# Patient Record
Sex: Female | Born: 1960
Health system: Southern US, Community
[De-identification: ages and names within clinical notes are randomized; demographics above are authoritative.]

## PROBLEM LIST (undated history)

## (undated) DIAGNOSIS — I10 Essential (primary) hypertension: Secondary | ICD-10-CM

## (undated) DIAGNOSIS — A0472 Enterocolitis due to Clostridium difficile, not specified as recurrent: Secondary | ICD-10-CM

## (undated) DIAGNOSIS — J45909 Unspecified asthma, uncomplicated: Secondary | ICD-10-CM

## (undated) DIAGNOSIS — T7840XA Allergy, unspecified, initial encounter: Secondary | ICD-10-CM

## (undated) DIAGNOSIS — H5702 Anisocoria: Secondary | ICD-10-CM

## (undated) DIAGNOSIS — E785 Hyperlipidemia, unspecified: Secondary | ICD-10-CM

## (undated) DIAGNOSIS — F419 Anxiety disorder, unspecified: Secondary | ICD-10-CM

## (undated) HISTORY — DX: Unspecified asthma, uncomplicated: J45.909

## (undated) HISTORY — PX: POLYPECTOMY: SHX149

## (undated) HISTORY — DX: Anisocoria: H57.02

## (undated) HISTORY — DX: Essential (primary) hypertension: I10

## (undated) HISTORY — DX: Anxiety disorder, unspecified: F41.9

## (undated) HISTORY — DX: Hyperlipidemia, unspecified: E78.5

## (undated) HISTORY — DX: Enterocolitis due to Clostridium difficile, not specified as recurrent: A04.72

## (undated) HISTORY — DX: Allergy, unspecified, initial encounter: T78.40XA

---

## 2005-12-03 HISTORY — PX: REFRACTIVE SURGERY: SHX103

## 2011-11-09 ENCOUNTER — Ambulatory Visit (INDEPENDENT_AMBULATORY_CARE_PROVIDER_SITE_OTHER): Payer: Commercial Managed Care - PPO

## 2011-11-09 DIAGNOSIS — G43009 Migraine without aura, not intractable, without status migrainosus: Secondary | ICD-10-CM

## 2011-11-09 DIAGNOSIS — R110 Nausea: Secondary | ICD-10-CM

## 2012-01-29 ENCOUNTER — Telehealth: Payer: Self-pay | Admitting: Family Medicine

## 2012-01-29 MED ORDER — TOPIRAMATE 50 MG PO TABS: 50.0000 mg | ORAL_TABLET | Freq: Two times a day (BID) | ORAL | Status: DC

## 2012-01-29 NOTE — Telephone Encounter (Signed)
Sent in to pharmacy.  

## 2012-01-29 NOTE — Telephone Encounter (Signed)
.  [facility] PATIENT NEEDS REFILL ON TOPOMAX.

## 2012-02-15 ENCOUNTER — Encounter: Payer: Self-pay | Admitting: Physician Assistant

## 2012-04-23 ENCOUNTER — Other Ambulatory Visit: Payer: Self-pay | Admitting: Physician Assistant

## 2012-04-23 ENCOUNTER — Ambulatory Visit (INDEPENDENT_AMBULATORY_CARE_PROVIDER_SITE_OTHER): Payer: Commercial Managed Care - PPO | Admitting: Physician Assistant

## 2012-04-23 DIAGNOSIS — G43909 Migraine, unspecified, not intractable, without status migrainosus: Secondary | ICD-10-CM

## 2012-04-23 MED ORDER — PREDNISONE 20 MG PO TABS: ORAL_TABLET | ORAL | Status: AC

## 2012-04-23 MED ORDER — TOPIRAMATE 50 MG PO TABS: 50.0000 mg | ORAL_TABLET | Freq: Two times a day (BID) | ORAL | Status: DC

## 2012-04-23 MED ORDER — CYCLOBENZAPRINE HCL 5 MG PO TABS
5.0000 mg | ORAL_TABLET | Freq: Three times a day (TID) | ORAL | Status: AC | PRN
Start: 2012-04-23 — End: 2012-05-03

## 2012-04-23 MED ORDER — METOPROLOL SUCCINATE ER 50 MG PO TB24: 50.0000 mg | ORAL_TABLET | Freq: Every day | ORAL | Status: DC

## 2012-04-23 MED ORDER — KETOROLAC TROMETHAMINE 60 MG/2ML IM SOLN
60.0000 mg | Freq: Once | INTRAMUSCULAR | Status: AC
  Administered 2012-04-23: 60 mg via INTRAMUSCULAR

## 2012-04-23 NOTE — Progress Notes (Signed)
Addended by: Sondra Barges on: 04/23/2012 05:47 PM   Modules accepted: Orders

## 2012-04-23 NOTE — Progress Notes (Signed)
Patient ID: Rebecca Bentley MRN: [id number], DOB: 05-Jun-1961, 51 y.o. Date of Encounter: 04/23/2012, 4:54 PM  Primary Physician: No primary provider on file.  Chief Complaint: Migraine  HPI: 51 y.o. year old female with history below presents with typical migraine. Symptoms developed 2 days prior. Typical symptoms. Positive photophobia and phonophobia. Some nausea without emesis. Feels like a band is wrapped around her head. No focal deficits. When symptoms began she took Imitrex which resolved the headache until yesterday evening. At that point she took another Imitrex which helped, but her headache has returned again today. She requests Toradol and Prednisone.    No past medical history on file.   Home Meds: Prior to Admission medications   Medication Sig Start Date End Date Taking? Authorizing Provider  ALPRAZolam Prudy Feeler) 0.5 MG tablet Take 0.5 mg by mouth at bedtime as needed.   Yes Historical Provider, MD  buPROPion (WELLBUTRIN XL) 150 MG 24 hr tablet Take 150 mg by mouth daily.   Yes Historical Provider, MD  escitalopram (LEXAPRO) 20 MG tablet Take 20 mg by mouth daily.   Yes Historical Provider, MD  levonorgestrel-ethinyl estradiol (AVIANE,ALESSE,LESSINA) 0.1-20 MG-MCG tablet Take 1 tablet by mouth daily.   Yes Historical Provider, MD  metoprolol succinate (TOPROL-XL) 50 MG 24 hr tablet Take 50 mg by mouth daily. Take with or immediately following a meal.   Yes Historical Provider, MD  SUMAtriptan (IMITREX) 100 MG tablet Take 100 mg by mouth every 2 (two) hours as needed.   Yes Historical Provider, MD  topiramate (TOPAMAX) 50 MG tablet Take 1 tablet (50 mg total) by mouth 2 (two) times daily. 01/29/12 01/28/13 Yes Sarah Harvie Bridge, PA-C  traZODone (DESYREL) 100 MG tablet Take 100 mg by mouth at bedtime.   Yes Historical Provider, MD  cyclobenzaprine (FLEXERIL) 5 MG tablet Take 1 tablet (5 mg total) by mouth 3 (three) times daily as needed for muscle spasms. 04/23/12 05/03/12  Perle Gibbon M Deamonte Sayegh, PA-C    predniSONE (DELTASONE) 20 MG tablet 3 PO FOR 3 DAYS, 2 PO FOR 3 DAYS, 1 PO FOR 3 DAYS 04/23/12 05/03/12  Sondra Barges, PA-C    Allergies:  Allergies  Allergen Reactions  . Penicillins Anaphylaxis    History   Social History  . Marital Status: Married    Spouse Name: N/A    Number of Children: N/A  . Years of Education: N/A   Occupational History  . Not on file.   Social History Main Topics  . Smoking status: Never Smoker   . Smokeless tobacco: Never Used  . Alcohol Use: Not on file  . Drug Use: Not on file  . Sexually Active: Not on file   Other Topics Concern  . Not on file   Social History Narrative  . No narrative on file     Review of Systems: Constitutional: negative for chills, fever, night sweats, weight changes, or fatigue  HEENT: negative for vision changes, hearing loss, congestion, rhinorrhea, ST, epistaxis, or sinus pressure Cardiovascular: negative for chest pain or palpitations Respiratory: negative for hemoptysis, wheezing, shortness of breath, or cough Abdominal: negative for abdominal pain, nausea, vomiting, diarrhea, or constipation Dermatological: negative for rash Neurologic: negative for dizziness, or syncope All other systems reviewed and are otherwise negative with the exception to those above and in the HPI.   Physical Exam: Blood pressure 118/78, pulse 76, temperature 98.2 F (36.8 C), temperature source Oral, resp. rate 18, height 5' 4.75" (1.645 m), weight 156 lb (70.761 kg), last  menstrual period 03/24/2012, SpO2 98.00%., Body mass index is 26.16 kg/(m^2). General: Well developed, well nourished, in no acute distress. Head: Normocephalic, atraumatic, eyes without discharge, sclera non-icteric, nares are without discharge. Bilateral auditory canals clear, TM's are without perforation, pearly grey and translucent with reflective cone of light bilaterally. Oral cavity moist, posterior pharynx without exudate, erythema, peritonsillar abscess, or  post nasal drip.  Neck: Supple. No thyromegaly. Full ROM. No lymphadenopathy. Lungs: Clear bilaterally to auscultation without wheezes, rales, or rhonchi. Breathing is unlabored. Heart: RRR with S1 S2. No murmurs, rubs, or gallops appreciated. Msk:  Strength and tone normal for age. Extremities/Skin: Warm and dry. No clubbing or cyanosis. No edema. No rashes or suspicious lesions. Neuro: Alert and oriented X 3. Moves all extremities spontaneously. Gait is normal. CNII-XII grossly in tact. DTR 2+ through out. Sensory/motor intact throughout.  Psych:  Responds to questions appropriately with a normal affect.     ASSESSMENT AND PLAN:  51 y.o. year old female with typical migraine. -Toradol 60 mg IM in office -Prednisone 20 mg #18 3x3, 2x3, 1x3 no RF -Flexeril 5 mg 1 po tid prn #90 RF 3 -RTC/ER precautions -Has Imitrex and Topamax at home for use   Signed, Eula Listen, PA-C 04/23/2012 4:54 PM

## 2012-04-23 NOTE — Progress Notes (Signed)
Addended by: Sondra Barges on: 04/23/2012 05:36 PM   Modules accepted: Orders

## 2012-05-21 ENCOUNTER — Telehealth: Payer: Self-pay | Admitting: Physician Assistant

## 2012-05-21 MED ORDER — LEVONORGESTREL-ETHINYL ESTRAD 0.1-20 MG-MCG PO TABS: 1.0000 | ORAL_TABLET | Freq: Every day | ORAL | Status: DC

## 2012-05-21 MED ORDER — ESCITALOPRAM OXALATE 20 MG PO TABS: 20.0000 mg | ORAL_TABLET | Freq: Every day | ORAL | Status: DC

## 2012-05-21 MED ORDER — TRAZODONE HCL 100 MG PO TABS: 100.0000 mg | ORAL_TABLET | Freq: Every day | ORAL | Status: DC

## 2012-05-21 NOTE — Telephone Encounter (Signed)
Patient needs RF of:  lutera Citalopram Trazodone  She will schedule a visit for this summer.  Authorized and sent.

## 2012-07-31 ENCOUNTER — Encounter: Payer: Self-pay | Admitting: Physician Assistant

## 2012-07-31 ENCOUNTER — Ambulatory Visit (INDEPENDENT_AMBULATORY_CARE_PROVIDER_SITE_OTHER): Payer: 59 | Admitting: Physician Assistant

## 2012-07-31 VITALS — BP 126/64 | HR 77 | Temp 98.1°F | Resp 16 | Ht 64.0 in | Wt 160.0 lb

## 2012-07-31 DIAGNOSIS — F419 Anxiety disorder, unspecified: Secondary | ICD-10-CM | POA: Insufficient documentation

## 2012-07-31 DIAGNOSIS — F329 Major depressive disorder, single episode, unspecified: Secondary | ICD-10-CM | POA: Insufficient documentation

## 2012-07-31 DIAGNOSIS — H5702 Anisocoria: Secondary | ICD-10-CM | POA: Insufficient documentation

## 2012-07-31 DIAGNOSIS — G43909 Migraine, unspecified, not intractable, without status migrainosus: Secondary | ICD-10-CM

## 2012-07-31 DIAGNOSIS — Z1211 Encounter for screening for malignant neoplasm of colon: Secondary | ICD-10-CM

## 2012-07-31 DIAGNOSIS — Z0000 Encounter for general adult medical examination without abnormal findings: Secondary | ICD-10-CM

## 2012-07-31 DIAGNOSIS — G4700 Insomnia, unspecified: Secondary | ICD-10-CM

## 2012-07-31 DIAGNOSIS — J45909 Unspecified asthma, uncomplicated: Secondary | ICD-10-CM

## 2012-07-31 DIAGNOSIS — F411 Generalized anxiety disorder: Secondary | ICD-10-CM

## 2012-07-31 DIAGNOSIS — E785 Hyperlipidemia, unspecified: Secondary | ICD-10-CM

## 2012-07-31 LAB — COMPREHENSIVE METABOLIC PANEL
ALT: 12 U/L (ref 0–35)
AST: 17 U/L (ref 0–37)
Albumin: 4.1 g/dL (ref 3.5–5.2)
BUN: 15 mg/dL (ref 6–23)
CO2: 24 mEq/L (ref 19–32)
Calcium: 8.8 mg/dL (ref 8.4–10.5)
Chloride: 107 mEq/L (ref 96–112)
Potassium: 4.3 mEq/L (ref 3.5–5.3)

## 2012-07-31 LAB — CBC WITH DIFFERENTIAL/PLATELET
Basophils Absolute: 0 10*3/uL (ref 0.0–0.1)
HCT: 37.4 % (ref 36.0–46.0)
Hemoglobin: 12.3 g/dL (ref 12.0–15.0)
Lymphocytes Relative: 21 % (ref 12–46)
Lymphs Abs: 1.7 10*3/uL (ref 0.7–4.0)
Monocytes Absolute: 0.4 10*3/uL (ref 0.1–1.0)
Neutro Abs: 5.8 10*3/uL (ref 1.7–7.7)
RBC: 4.31 MIL/uL (ref 3.87–5.11)
RDW: 13.6 % (ref 11.5–15.5)
WBC: 8.2 10*3/uL (ref 4.0–10.5)

## 2012-07-31 LAB — POCT URINALYSIS DIPSTICK
Glucose, UA: NEGATIVE
Nitrite, UA: NEGATIVE
Protein, UA: NEGATIVE
Urobilinogen, UA: 1

## 2012-07-31 LAB — POCT UA - MICROSCOPIC ONLY: Yeast, UA: NEGATIVE

## 2012-07-31 MED ORDER — TOPIRAMATE 50 MG PO TABS: 50.0000 mg | ORAL_TABLET | Freq: Two times a day (BID) | ORAL | Status: DC

## 2012-07-31 MED ORDER — ALPRAZOLAM 0.5 MG PO TABS: 0.5000 mg | ORAL_TABLET | Freq: Three times a day (TID) | ORAL | Status: DC | PRN

## 2012-07-31 MED ORDER — METOPROLOL SUCCINATE ER 50 MG PO TB24: 50.0000 mg | ORAL_TABLET | Freq: Every day | ORAL | Status: DC

## 2012-07-31 MED ORDER — BECLOMETHASONE DIPROPIONATE 40 MCG/ACT IN AERS: 2.0000 | INHALATION_SPRAY | Freq: Two times a day (BID) | RESPIRATORY_TRACT | Status: DC

## 2012-07-31 NOTE — Patient Instructions (Signed)

## 2012-07-31 NOTE — Progress Notes (Signed)
Subjective:    Patient ID: Rebecca Bentley, female    DOB: 12-07-1960, 51 y.o.   MRN: [id number]  HPI This 51 y.o. female presents for CPE.  Pap and HPV testing were normal 08/2011.    Review of Systems  Constitutional: Negative.   HENT: Negative.   Eyes: Negative.   Respiratory: Negative.   Cardiovascular: Negative.   Gastrointestinal: Negative.   Genitourinary: Negative.   Musculoskeletal: Negative.   Skin: Negative.   Neurological: Negative.   Hematological: Negative.   Psychiatric/Behavioral: Disturbed wake/sleep cycle: difficulty staying asleep. Alprazolam helps.      Past Medical History  Diagnosis Date  . Migraine   . Hyperlipidemia   . Anxiety   . Anisocoria   . RAD (reactive airway disease)     Past Surgical History  Procedure Date  . Refractive surgery 2007    Dr. Nile Riggs    Prior to Admission medications   Medication Sig Start Date End Date Taking? Authorizing Provider  ALPRAZolam Prudy Feeler) 0.5 MG tablet Take 0.5 mg by mouth at bedtime as needed.   Yes Historical Provider, MD  buPROPion (WELLBUTRIN XL) 150 MG 24 hr tablet Take 150 mg by mouth daily.   Yes Historical Provider, MD  escitalopram (LEXAPRO) 20 MG tablet Take 1 tablet (20 mg total) by mouth daily. 05/21/12  Yes Marcine Gadway Tessa Lerner, PA-C  levonorgestrel-ethinyl estradiol (AVIANE,ALESSE,LESSINA) 0.1-20 MG-MCG tablet Take 1 tablet by mouth daily. 05/21/12  Yes Audry Kauzlarich S Aubrielle Stroud, PA-C  metoprolol succinate (TOPROL-XL) 50 MG 24 hr tablet Take 1 tablet (50 mg total) by mouth daily. Take with or immediately following a meal. 04/23/12  Yes Ryan M Dunn, PA-C  SUMAtriptan (IMITREX) 100 MG tablet Take 100 mg by mouth every 2 (two) hours as needed.   Yes Historical Provider, MD  topiramate (TOPAMAX) 50 MG tablet Take 1 tablet (50 mg total) by mouth 2 (two) times daily. 04/23/12 04/23/13 Yes Ryan M Dunn, PA-C  traZODone (DESYREL) 100 MG tablet Take 1 tablet (100 mg total) by mouth at bedtime. 05/21/12  Yes Annise Boran Tessa Lerner,  PA-C    Allergies  Allergen Reactions  . Penicillins Anaphylaxis    History   Social History  . Marital Status: Married    Spouse Name: Chrissie Noa    Number of Children: 1  . Years of Education: N/A   Occupational History  . 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 Smoking status: Never Smoker   . Smokeless tobacco: Never Used  . Alcohol Use: Yes  . Drug Use: No  . Sexually Active: Yes -- Female partner(s)   Other Topics Concern  . Not on file   Social History Narrative  . No narrative on file    Family History  Problem Relation Age of Onset  . Hyperlipidemia Father   . Heart disease Father     stenting  . Migraines Father   . Mental illness Brother     anxiety/depression  . Migraines Daughter        Objective:   Physical Exam  Vitals reviewed. Constitutional: She is oriented to person, place, and time. Vital signs are normal. She appears well-developed and well-nourished. No distress.  HENT:  Head: Normocephalic and atraumatic.  Right Ear: Hearing, tympanic membrane, external ear and ear canal normal. No foreign bodies.  Left Ear: Hearing, tympanic membrane, external ear and ear canal normal. No foreign bodies.  Nose: Nose normal.  Mouth/Throat: Uvula is midline, oropharynx  is clear and moist and mucous membranes are normal. No oral lesions. Normal dentition. No dental abscesses or uvula swelling. No oropharyngeal exudate.  Eyes: Conjunctivae and EOM are normal. Pupils are equal, round, and reactive to light. Right eye exhibits no discharge. Left eye exhibits no discharge. No scleral icterus.  Fundoscopic exam:      The right eye shows no arteriolar narrowing, no AV nicking, no exudate, no hemorrhage and no papilledema. The right eye shows red reflex.The right eye shows no venous pulsations.      The left eye shows no arteriolar narrowing, no AV nicking, no exudate, no hemorrhage and no papilledema. The left eye shows red  reflex.The left eye shows no venous pulsations. Neck: Trachea normal, normal range of motion and full passive range of motion without pain. Neck supple. No spinous process tenderness and no muscular tenderness present. No mass and no thyromegaly present.  Cardiovascular: Normal rate, regular rhythm, normal heart sounds, intact distal pulses and normal pulses.   Pulmonary/Chest: Effort normal and breath sounds normal. She exhibits no mass, no crepitus, no edema and no retraction. Right breast exhibits no inverted nipple, no mass, no nipple discharge, no skin change and no tenderness. Left breast exhibits no inverted nipple, no mass, no nipple discharge, no skin change and no tenderness. Breasts are symmetrical.  Genitourinary: Rectum normal and vagina normal.  Musculoskeletal: She exhibits no edema and no tenderness.       Cervical back: Normal.       Thoracic back: Normal.       Lumbar back: Normal.  Lymphadenopathy:       Head (right side): No tonsillar, no preauricular, no posterior auricular and no occipital adenopathy present.       Head (left side): No tonsillar, no preauricular, no posterior auricular and no occipital adenopathy present.    She has no cervical adenopathy.    She has no axillary adenopathy.       Right: No supraclavicular adenopathy present.       Left: No supraclavicular adenopathy present.  Neurological: She is alert and oriented to person, place, and time. She has normal strength and normal reflexes. No cranial nerve deficit. She exhibits normal muscle tone. Coordination and gait normal.  Skin: Skin is warm, dry and intact. No rash noted. She is not diaphoretic. No cyanosis or erythema. Nails show no clubbing.  Psychiatric: She has a normal mood and affect. Her speech is normal and behavior is normal. Judgment and thought content normal.   Results for orders placed in visit on 07/31/12  POCT URINALYSIS DIPSTICK      Component Value Range   Color, UA yellow      Clarity, UA clear     Glucose, UA neg     Bilirubin, UA neg     Ketones, UA neg     Spec Grav, UA 1.020     Blood, UA neg     pH, UA 6.0     Protein, UA neg     Urobilinogen, UA 1.0     Nitrite, UA neg     Leukocytes, UA Trace    POCT UA - MICROSCOPIC ONLY      Component Value Range   WBC, Ur, HPF, POC 0-2     RBC, urine, microscopic 0-1     Bacteria, U Microscopic trace     Mucus, UA neg     Epithelial cells, urine per micros 0-2     Crystals, Ur, HPF, POC neg  Casts, Ur, LPF, POC neg     Yeast, UA neg        Assessment & Plan:   1. Routine general medical examination at a health care facility  POCT urinalysis dipstick, POCT UA - Microscopic Only, CBC with Differential  2. Hyperlipidemia  Comprehensive metabolic panel, Lipid panel  3. Insomnia  TSH, ALPRAZolam (XANAX) 0.5 MG tablet  4. Migraine  topiramate (TOPAMAX) 50 MG tablet, metoprolol succinate (TOPROL-XL) 50 MG 24 hr tablet  5. RAD (reactive airway disease)  beclomethasone (QVAR) 40 MCG/ACT inhaler  6. Anxiety  TSH, ALPRAZolam (XANAX) 0.5 MG tablet  7. Screening for colon cancer  Ambulatory referral to Gastroenterology

## 2012-08-01 ENCOUNTER — Encounter: Payer: Self-pay | Admitting: Internal Medicine

## 2012-08-01 ENCOUNTER — Other Ambulatory Visit: Payer: Self-pay | Admitting: Physician Assistant

## 2012-08-01 ENCOUNTER — Encounter: Payer: Self-pay | Admitting: Physician Assistant

## 2012-08-01 MED ORDER — ATORVASTATIN CALCIUM 20 MG PO TABS: 20.0000 mg | ORAL_TABLET | Freq: Every day | ORAL | Status: DC

## 2012-08-03 HISTORY — PX: COLONOSCOPY: SHX5424

## 2012-08-12 ENCOUNTER — Encounter: Payer: Self-pay | Admitting: Internal Medicine

## 2012-08-12 ENCOUNTER — Ambulatory Visit (AMBULATORY_SURGERY_CENTER): Payer: 59

## 2012-08-12 VITALS — Ht 64.0 in | Wt 160.6 lb

## 2012-08-12 DIAGNOSIS — Z1211 Encounter for screening for malignant neoplasm of colon: Secondary | ICD-10-CM

## 2012-08-12 MED ORDER — NA SULFATE-K SULFATE-MG SULF 17.5-3.13-1.6 GM/177ML PO SOLN: 1.0000 | Freq: Once | ORAL | Status: DC

## 2012-08-21 ENCOUNTER — Encounter: Payer: Self-pay | Admitting: Internal Medicine

## 2012-08-25 ENCOUNTER — Ambulatory Visit (AMBULATORY_SURGERY_CENTER): Payer: 59 | Admitting: Internal Medicine

## 2012-08-25 ENCOUNTER — Encounter: Payer: Self-pay | Admitting: Internal Medicine

## 2012-08-25 VITALS — BP 125/81 | HR 88 | Temp 99.0°F | Resp 16 | Ht 64.0 in | Wt 160.0 lb

## 2012-08-25 DIAGNOSIS — D129 Benign neoplasm of anus and anal canal: Secondary | ICD-10-CM

## 2012-08-25 DIAGNOSIS — Z1211 Encounter for screening for malignant neoplasm of colon: Secondary | ICD-10-CM

## 2012-08-25 DIAGNOSIS — D126 Benign neoplasm of colon, unspecified: Secondary | ICD-10-CM

## 2012-08-25 DIAGNOSIS — D128 Benign neoplasm of rectum: Secondary | ICD-10-CM

## 2012-08-25 MED ORDER — SODIUM CHLORIDE 0.9 % IV SOLN: 500.0000 mL | INTRAVENOUS | Status: DC

## 2012-08-25 NOTE — Progress Notes (Signed)
Propofol given and oxygen managed per J Nulty CRNA  Abdominal pressure applied to reach cecum

## 2012-08-25 NOTE — Progress Notes (Signed)
Patient did not experience any of the following events: a burn prior to discharge; a fall within the facility; wrong site/side/patient/procedure/implant event; or a hospital transfer or hospital admission upon discharge from the facility. (G8907) Patient did not have preoperative order for IV antibiotic SSI prophylaxis. (G8918)  

## 2012-08-25 NOTE — Patient Instructions (Addendum)
The colonoscopy showed one polyp, removed. It looks benign. It was otherwise normal.  I will send you a letter about the pathology results and timing of the next routine colonoscopy.  Thank you for choosing me and [facility].  Iva Boop, MD, FACG  YOU HAD AN ENDOSCOPIC PROCEDURE TODAY AT THE [facility]: Refer to the procedure report that was given to you for any specific questions about what was found during the examination.  If the procedure report does not answer your questions, please call your gastroenterologist to clarify.  If you requested that your care partner not be given the details of your procedure findings, then the procedure report has been included in a sealed envelope for you to review at your convenience later.  YOU SHOULD EXPECT: Some feelings of bloating in the abdomen. Passage of more gas than usual.  Walking can help get rid of the air that was put into your GI tract during the procedure and reduce the bloating. If you had a lower endoscopy (such as a colonoscopy or flexible sigmoidoscopy) you may notice spotting of blood in your stool or on the toilet paper. If you underwent a bowel prep for your procedure, then you may not have a normal bowel movement for a few days.  DIET: Your first meal following the procedure should be a light meal and then it is ok to progress to your normal diet.  A half-sandwich or bowl of soup is an example of a good first meal.  Heavy or fried foods are harder to digest and may make you feel nauseous or bloated.  Likewise meals heavy in dairy and vegetables can cause extra gas to form and this can also increase the bloating.  Drink plenty of fluids but you should avoid alcoholic beverages for 24 hours.  ACTIVITY: Your care partner should take you home directly after the procedure.  You should plan to take it easy, moving slowly for the rest of the day.  You can resume normal activity the day after the procedure however  you should NOT DRIVE or use heavy machinery for 24 hours (because of the sedation medicines used during the test).    SYMPTOMS TO REPORT IMMEDIATELY: A gastroenterologist can be reached at any hour.  During normal business hours, 8:30 AM to 5:00 PM Monday through Friday, call [phone].  After hours and on weekends, please call the GI answering service at [phone] who will take a message and have the physician on call contact you.   Following lower endoscopy (colonoscopy or flexible sigmoidoscopy):  Excessive amounts of blood in the stool  Significant tenderness or worsening of abdominal pains  Swelling of the abdomen that is new, acute  Fever of 100F or higher  Following upper endoscopy (EGD)  Vomiting of blood or coffee ground material  New chest pain or pain under the shoulder blades  Painful or persistently difficult swallowing  New shortness of breath  Fever of 100F or higher  Black, tarry-looking stools  FOLLOW UP: If any biopsies were taken you will be contacted by phone or by letter within the next 1-3 weeks.  Call your gastroenterologist if you have not heard about the biopsies in 3 weeks.  Our staff will call the home number listed on your records the next business day following your procedure to check on you and address any questions or concerns that you may have at that time regarding the information given to you following your procedure. This is  a courtesy call and so if there is no answer at the home number and we have not heard from you through the emergency physician on call, we will assume that you have returned to your regular daily activities without incident.  SIGNATURES/CONFIDENTIALITY: You and/or your care partner have signed paperwork which will be entered into your electronic medical record.  These signatures attest to the fact that that the information above on your After Visit Summary has been reviewed and is understood.  Full responsibility of the  confidentiality of this discharge information lies with you and/or your care-partner.

## 2012-08-25 NOTE — Op Note (Signed)
[facility] [address] [place], [address]   COLONOSCOPY PROCEDURE REPORT  PATIENT: Rebecca Bentley, Rebecca Bentley  MR#: [id number] BIRTHDATE: 1961/07/19 , 51  yrs. old GENDER: Female ENDOSCOPIST: Iva Boop, MD, [facility] REFERRED XB:JYNWGN Tinnie Gens, [place] PROCEDURE DATE:  08/25/2012 PROCEDURE:   Colonoscopy with snare polypectomy ASA CLASS:   Class II INDICATIONS:average risk screening. MEDICATIONS: Propofol (Diprivan) 280 mg IV, MAC sedation, administered by CRNA, and These medications were titrated to patient response per physician's verbal order  DESCRIPTION OF PROCEDURE:   After the risks benefits and alternatives of the procedure were thoroughly explained, informed consent was obtained.  A digital rectal exam revealed no abnormalities of the rectum.   The LB [id number]-[id number]  endoscope was introduced through the anus and advanced to the cecum, which was identified by both the appendix and ileocecal valve. No adverse events experienced.   The quality of the prep was Suprep excellent The instrument was then slowly withdrawn as the colon was fully examined.      COLON FINDINGS: A polypoid shaped pedunculated polyp measuring 8 mm in size was found in the rectum.  A polypectomy was performed using snare cautery.  The resection was complete and the polyp tissue was completely retrieved.   The colon mucosa was otherwise normal. Retroflexed views revealed no abnormalities. The time to cecum=5 minutes 10 seconds.  Withdrawal time=10 minutes 49 seconds.  The scope was withdrawn and the procedure completed. COMPLICATIONS: There were no complications.  ENDOSCOPIC IMPRESSION: 1.   Pedunculated polyp measuring 8 mm in size was found in the rectum; polypectomy was performed using snare cautery 2.   The colon mucosa was otherwise normal with excellent prep  RECOMMENDATIONS: 1.  Timing of repeat colonoscopy will be determined by pathology findings. 2.  Hold aspirin, aspirin  products, and anti-inflammatory medication for 1 week.   eSigned:  Iva Boop, MD, [facility] 08/25/2012 11:21 AM   cc: The Patient    and Theora Gianotti, PA-C

## 2012-08-26 ENCOUNTER — Telehealth: Payer: Self-pay | Admitting: *Deleted

## 2012-08-26 NOTE — Telephone Encounter (Signed)
  Follow up Call-  Call back number 08/25/2012  Post procedure Call Back phone  # [phone]  Permission to leave phone message Yes     [facility]

## 2012-08-29 ENCOUNTER — Encounter: Payer: Self-pay | Admitting: Internal Medicine

## 2012-08-29 DIAGNOSIS — Z860100 Personal history of colon polyps, unspecified: Secondary | ICD-10-CM | POA: Insufficient documentation

## 2012-08-29 HISTORY — DX: Personal history of colonic polyps: Z86.010

## 2012-08-29 HISTORY — DX: Personal history of colon polyps, unspecified: Z86.0100

## 2012-08-29 NOTE — Progress Notes (Signed)
Quick Note:  8 mm tubular adenoma Repeat colonoscopy about 08/2017 ______

## 2012-10-10 ENCOUNTER — Ambulatory Visit (INDEPENDENT_AMBULATORY_CARE_PROVIDER_SITE_OTHER): Payer: 59 | Admitting: Physician Assistant

## 2012-10-10 VITALS — BP 138/90 | HR 86 | Temp 98.6°F | Resp 16 | Ht 64.0 in | Wt 164.4 lb

## 2012-10-10 DIAGNOSIS — K0510 Chronic gingivitis, plaque induced: Secondary | ICD-10-CM

## 2012-10-10 DIAGNOSIS — K0889 Other specified disorders of teeth and supporting structures: Secondary | ICD-10-CM

## 2012-10-10 DIAGNOSIS — K089 Disorder of teeth and supporting structures, unspecified: Secondary | ICD-10-CM

## 2012-10-10 MED ORDER — CLINDAMYCIN HCL 300 MG PO CAPS: 300.0000 mg | ORAL_CAPSULE | Freq: Three times a day (TID) | ORAL | Status: DC

## 2012-10-10 MED ORDER — BENZOCAINE 20 % MT GEL: 1.0000 "application " | Freq: Four times a day (QID) | OROMUCOSAL | Status: DC | PRN

## 2012-10-10 MED ORDER — HYDROCODONE-ACETAMINOPHEN 5-325 MG PO TABS: 1.0000 | ORAL_TABLET | Freq: Four times a day (QID) | ORAL | Status: DC | PRN

## 2012-10-10 NOTE — Progress Notes (Signed)
Patient ID: Rebecca Bentley MRN: [id number], DOB: 1961/11/12, 51 y.o. Date of Encounter: 10/10/2012, 4:55 PM  Primary Physician: JEFFERY,CHELLE, PA-C  Chief Complaint: Tooth pain for 5 days  HPI: 51 y.o. year old female with history below presents with five day history of pain behind her last tooth on the bottom left. Patient notes this area to be tender to the touch. Has not had any drainage or discharge from this area. No pain with hot or cold drinks or food. Afebrile. No nausea or vomiting.    Past Medical History  Diagnosis Date  . Migraine   . Hyperlipidemia   . Anxiety   . Anisocoria   . RAD (reactive airway disease)   . Allergy      Home Meds: Prior to Admission medications   Medication Sig Start Date End Date Taking? Authorizing Provider  ALPRAZolam Prudy Feeler) 0.5 MG tablet Take 1 tablet (0.5 mg total) by mouth 3 (three) times daily as needed. 07/31/12  Yes Chelle S Jeffery, PA-C  atorvastatin (LIPITOR) 20 MG tablet Take 1 tablet (20 mg total) by mouth daily. 08/01/12 08/01/13 Yes Chelle S Jeffery, PA-C  beclomethasone (QVAR) 40 MCG/ACT inhaler Inhale 2 puffs into the lungs 2 (two) times daily. 07/31/12 07/31/13 Yes Chelle S Jeffery, PA-C  buPROPion (WELLBUTRIN XL) 150 MG 24 hr tablet Take 150 mg by mouth daily.   Yes Historical Provider, MD  escitalopram (LEXAPRO) 20 MG tablet Take 1 tablet (20 mg total) by mouth daily. 05/21/12  Yes Chelle Tessa Lerner, PA-C  levonorgestrel-ethinyl estradiol (AVIANE,ALESSE,LESSINA) 0.1-20 MG-MCG tablet Take 1 tablet by mouth daily. 05/21/12  Yes Chelle S Jeffery, PA-C  loratadine (CLARITIN) 10 MG tablet Take 10 mg by mouth daily.   Yes Historical Provider, MD  metoprolol succinate (TOPROL-XL) 50 MG 24 hr tablet Take 1 tablet (50 mg total) by mouth daily. Take with or immediately following a meal. 07/31/12  Yes Chelle S Jeffery, PA-C  Multiple Vitamin (MULTIVITAMIN) tablet Take 1 tablet by mouth daily.   Yes Historical Provider, MD  SUMAtriptan (IMITREX) 100  MG tablet Take 100 mg by mouth every 2 (two) hours as needed.   Yes Historical Provider, MD  traZODone (DESYREL) 100 MG tablet Take 1 tablet (100 mg total) by mouth at bedtime. 05/21/12  Yes Chelle S Jeffery, PA-C                       topiramate (TOPAMAX) 50 MG tablet Take 1 tablet (50 mg total) by mouth 2 (two) times daily. 07/31/12 07/31/13  Chelle Tessa Lerner, PA-C    Allergies:  Allergies  Allergen Reactions  . Penicillins Anaphylaxis    History   Social History  . Marital Status: Married    Spouse Name: Chrissie Noa    Number of Children: 1  . Years of Education: N/A   Occupational History  . 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 Smoking status: Never Smoker   . Smokeless tobacco: Never Used  . Alcohol Use: No  . Drug Use: No  . Sexually Active: Yes -- Female partner(s)    Birth Control/ Protection: Pill   Other Topics Concern  . Not on file   Social History Narrative  . No narrative on file     Review of Systems: Constitutional: negative for chills, fever, night sweats, weight changes, or fatigue  HEENT: negative for vision changes, hearing loss, congestion, rhinorrhea, ST, epistaxis, or sinus pressure  Cardiovascular: negative for chest pain or palpitations Respiratory: negative for hemoptysis, wheezing, shortness of breath, or cough Neurologic: negative for dizziness, or syncope   Physical Exam: Blood pressure 138/90, pulse 86, temperature 98.6 F (37 C), resp. rate 16, height 5\' 4"  (1.626 m), weight 164 lb 6.4 oz (74.571 kg), last menstrual period 10/09/2012, SpO2 97.00%., Body mass index is 28.22 kg/(m^2). General: Well developed, well nourished, in no acute distress. Head: Normocephalic, atraumatic, eyes without discharge, sclera non-icteric, nares are without discharge. Bilateral auditory canals clear, TM's are without perforation, pearly grey and translucent with reflective cone of light bilaterally. Oral cavity moist,  posterior pharynx without exudate, erythema, peritonsillar abscess, or post nasal drip. Mild cellulitis of the bottom posterior gum line line behind tooth number 31. No fluctuance, drainage, or abscess at this time. Area to tender to palpation.   Neck: Supple. No thyromegaly. Full ROM. < 2 cm AC right side. Lungs: Clear bilaterally to auscultation without wheezes, rales, or rhonchi. Breathing is unlabored. Heart: RRR with S1 S2. No murmurs, rubs, or gallops appreciated. Msk:  Strength and tone normal for age. Extremities/Skin: Warm and dry. No clubbing or cyanosis. No edema. No rashes or suspicious lesions. Neuro: Alert and oriented X 3. Moves all extremities spontaneously. Gait is normal. CNII-XII grossly in tact. Psych:  Responds to questions appropriately with a normal affect.     ASSESSMENT AND PLAN:  51 y.o. year old female with gingivitis and early cellulitis of the gum -Clindamycin 300 mg 1 po tid #30 no RF -Norco 5/325 mg 1 po q 4-6 hours prn pain #30 no RF, SED -Orabase 20% gel 1 application qid prn #1 RF 2 -No abscess at this time    Elinor Dodge, PA-C 10/10/2012 4:55 PM

## 2012-10-23 ENCOUNTER — Other Ambulatory Visit: Payer: Self-pay | Admitting: Physician Assistant

## 2012-10-23 DIAGNOSIS — Z1231 Encounter for screening mammogram for malignant neoplasm of breast: Secondary | ICD-10-CM

## 2012-10-23 DIAGNOSIS — Z1239 Encounter for other screening for malignant neoplasm of breast: Secondary | ICD-10-CM

## 2012-10-27 ENCOUNTER — Other Ambulatory Visit: Payer: Self-pay | Admitting: Physician Assistant

## 2012-10-27 DIAGNOSIS — Z803 Family history of malignant neoplasm of breast: Secondary | ICD-10-CM

## 2012-10-27 DIAGNOSIS — Z1231 Encounter for screening mammogram for malignant neoplasm of breast: Secondary | ICD-10-CM

## 2012-11-04 ENCOUNTER — Ambulatory Visit (INDEPENDENT_AMBULATORY_CARE_PROVIDER_SITE_OTHER): Payer: 59 | Admitting: Physician Assistant

## 2012-11-04 VITALS — BP 122/85 | HR 101 | Temp 98.2°F | Resp 16

## 2012-11-04 DIAGNOSIS — R110 Nausea: Secondary | ICD-10-CM

## 2012-11-04 DIAGNOSIS — R197 Diarrhea, unspecified: Secondary | ICD-10-CM

## 2012-11-04 LAB — POCT CBC
Granulocyte percent: 70.2 %G (ref 37–80)
MID (cbc): 0.7 (ref 0–0.9)
MPV: 8.9 fL (ref 0–99.8)
POC Granulocyte: 6.1 (ref 2–6.9)
POC MID %: 7.8 %M (ref 0–12)
Platelet Count, POC: 347 10*3/uL (ref 142–424)
RBC: 4.78 M/uL (ref 4.04–5.48)

## 2012-11-04 LAB — BASIC METABOLIC PANEL
Chloride: 108 mEq/L (ref 96–112)
Potassium: 3.2 mEq/L — ABNORMAL LOW (ref 3.5–5.3)

## 2012-11-04 MED ORDER — ONDANSETRON 4 MG PO TBDP
8.0000 mg | ORAL_TABLET | Freq: Once | ORAL | Status: AC
  Administered 2012-11-04: 8 mg via ORAL

## 2012-11-04 NOTE — Progress Notes (Signed)
   [address] [place] [address]   Phone [phone]  Subjective:    Patient ID: Rebecca Bentley, female    DOB: 06-21-1961, 51 y.o.   MRN: [id number]  HPI Pt presents to clinic with 4 day h/o explosive diarrhea.Went shopping on Friday and ate a chicken wrap and then sat developed nausea with vomiting and diarrhea.  She has not vomited since Sat but she is still nauseated.  She gets abd cramping and then has diarrhea - did take some Imodium last night which has slowed down the diarrhea but it is still watery.  She has been drinking ok and has tried to eat for the last two days. She was at a meeting and started to feel nauseated but has not thrown up.  She has no fevers or other cold symptoms.  She has used some phenergan but it makes her really sleepy.  There are no other people at home that are sick with similar symptoms.  No recent travel or camping trips.  Review of Systems  Constitutional: Negative for fever and chills.  Gastrointestinal: Positive for nausea, vomiting (none in 4 days) and diarrhea. Negative for blood in stool.  Neurological: Positive for headaches. Negative for dizziness.       Objective:   Physical Exam  Vitals reviewed. Constitutional: She is oriented to person, place, and time. She appears well-developed and well-nourished.       Laying on bed - appears to not feel well  HENT:  Head: Normocephalic and atraumatic.  Right Ear: External ear normal.  Left Ear: External ear normal.  Eyes: Conjunctivae normal are normal.  Cardiovascular: Normal rate, regular rhythm and normal heart sounds.  Exam reveals no gallop.   No murmur heard. Pulmonary/Chest: Effort normal and breath sounds normal.  Abdominal: Soft. There is tenderness (generalized).  Neurological: She is alert and oriented to person, place, and time.  Skin: Skin is warm and dry.  Psychiatric: She has a normal mood and affect. Her behavior is normal. Judgment and thought content normal.   Pt feels much  better after 2 L of fluids.    Assessment & Plan:   1. Nausea  ondansetron (ZOFRAN-ODT) disintegrating tablet 8 mg  2. Diarrhea  POCT CBC, Basic metabolic panel   Pt given zofran for home.  Push fluids.  Advance diet as tolerated.  Pt instructed to take Imodium for one more dose and then stop as long as her stool continues to firm up.  She is instructed to RTC if diarrhea continues tomorrow after stopping the imodium for stool cultures though I really think this is a viral infection.

## 2012-11-06 ENCOUNTER — Ambulatory Visit
Admission: RE | Admit: 2012-11-06 | Discharge: 2012-11-06 | Disposition: A | Discharge status: Home / Self Care | Payer: 59 | Source: Ambulatory Visit | Attending: Family Medicine | Admitting: Family Medicine

## 2012-11-06 ENCOUNTER — Ambulatory Visit (INDEPENDENT_AMBULATORY_CARE_PROVIDER_SITE_OTHER): Payer: 59 | Admitting: Family Medicine

## 2012-11-06 VITALS — BP 113/77 | HR 96 | Temp 98.3°F | Resp 16 | Ht 64.0 in | Wt 157.0 lb

## 2012-11-06 DIAGNOSIS — R1110 Vomiting, unspecified: Secondary | ICD-10-CM

## 2012-11-06 DIAGNOSIS — R197 Diarrhea, unspecified: Secondary | ICD-10-CM

## 2012-11-06 DIAGNOSIS — R110 Nausea: Secondary | ICD-10-CM

## 2012-11-06 DIAGNOSIS — G43909 Migraine, unspecified, not intractable, without status migrainosus: Secondary | ICD-10-CM

## 2012-11-06 LAB — POCT URINALYSIS DIPSTICK
Ketones, UA: >=160
Protein, UA: 100
Spec Grav, UA: >=1.03
pH, UA: 6

## 2012-11-06 LAB — POCT CBC
MCH, POC: 27.4 pg (ref 27–31.2)
MCV: 89.9 fL (ref 80–97)
MID (cbc): 0.7 (ref 0–0.9)
MPV: 8.9 fL (ref 0–99.8)
POC LYMPH PERCENT: 18.2 %L (ref 10–50)
POC MID %: 8.4 %M (ref 0–12)
Platelet Count, POC: 330 10*3/uL (ref 142–424)
RBC: 4.38 M/uL (ref 4.04–5.48)
RDW, POC: 13.8 %
WBC: 8.8 10*3/uL (ref 4.6–10.2)

## 2012-11-06 LAB — COMPREHENSIVE METABOLIC PANEL
ALT: 12 U/L (ref 0–35)
AST: 11 U/L (ref 0–37)
Alkaline Phosphatase: 59 U/L (ref 39–117)
BUN: 5 mg/dL — ABNORMAL LOW (ref 6–23)
Chloride: 104 mEq/L (ref 96–112)
Creat: 0.82 mg/dL (ref 0.50–1.10)
Total Bilirubin: 0.4 mg/dL (ref 0.3–1.2)

## 2012-11-06 LAB — POCT UA - MICROSCOPIC ONLY: Crystals, Ur, HPF, POC: NEGATIVE

## 2012-11-06 MED ORDER — ONDANSETRON 4 MG PO TBDP
4.0000 mg | ORAL_TABLET | Freq: Once | ORAL | Status: AC
  Administered 2012-11-06: 4 mg via ORAL

## 2012-11-06 MED ORDER — METRONIDAZOLE 500 MG PO TABS: 500.0000 mg | ORAL_TABLET | Freq: Three times a day (TID) | ORAL | Status: DC

## 2012-11-06 MED ORDER — CIPROFLOXACIN HCL 500 MG PO TABS: 500.0000 mg | ORAL_TABLET | Freq: Two times a day (BID) | ORAL | Status: DC

## 2012-11-06 MED ORDER — IOHEXOL 300 MG/ML  SOLN
100.0000 mL | Freq: Once | INTRAMUSCULAR | Status: AC | PRN
  Administered 2012-11-06: 100 mL via INTRAVENOUS

## 2012-11-06 MED ORDER — KETOROLAC TROMETHAMINE 60 MG/2ML IM SOLN
60.0000 mg | Freq: Once | INTRAMUSCULAR | Status: AC
  Administered 2012-11-06: 60 mg via INTRAMUSCULAR

## 2012-11-06 NOTE — Progress Notes (Signed)
[address]   [place], [place]  [address]   [phone]  Subjective:    Patient ID: Rebecca Bentley, female    DOB: November 20, 1961, 51 y.o.   MRN: [id number]  HPIThis 51 y.o. female presents for evaluation of n/v/d.  Onset five days ago.  No fever but +chills/sweats.  +nausea with vomiting; severe nausea; last vomiting three days ago.  Explosive diarrhea; has had four episodes of diarrhea since 4 am.  +mucousy; non-bloody.  No melena.  +abdominal pain B lower.  No history of diverticulosis.  Colonoscopy 08/2012 no diverticulosis; s/p colon polyp resection.  No other sick contacts.  +clindamycin in past month.  No recent travel; went to the beach over the weekend; no seafood.  Thought was food poisoning initially.  Yesterday had 3 stools upon awakening; slept most of the day .  Took Imodium three days ago which slowed things down.  Diarrhea is decreasing.  Ate toast and grape popsicle; drinking water all day.  Normal urine output; no dark urine.  +dizziness.  Developed migraine two days ago; took Imitrex yesterday without improvement; typical migraine located L periorbital region.  Moderate abdominal pain; severity 6/10; constant pain 6/10.  Evaluated two days ago by Benny Lennert, PA-C.  S/p 2 liters iv fluids.  CBC normal; BMET normal other than K 3.2.  Drinking good fluids during the day; does not feel dehydrated.     Review of Systems  Constitutional: Positive for chills and fatigue. Negative for fever and diaphoresis.  Gastrointestinal: Positive for nausea, abdominal pain and diarrhea. Negative for vomiting, constipation, blood in stool and abdominal distention.  Skin: Negative for rash.  Neurological: Positive for dizziness and headaches. Negative for tremors, syncope, facial asymmetry, speech difficulty, weakness, light-headedness and numbness.  Psychiatric/Behavioral: Negative for confusion.        Past Medical History  Diagnosis Date  . Migraine   . Hyperlipidemia   . Anxiety   .  Anisocoria   . RAD (reactive airway disease)   . Allergy     Past Surgical History  Procedure Date  . Refractive surgery 2007    Dr. Nile Riggs    Prior to Admission medications   Medication Sig Start Date End Date Taking? Authorizing Provider  ALPRAZolam Prudy Feeler) 0.5 MG tablet Take 1 tablet (0.5 mg total) by mouth 3 (three) times daily as needed. 07/31/12  Yes Chelle S Jeffery, PA-C  atorvastatin (LIPITOR) 20 MG tablet Take 1 tablet (20 mg total) by mouth daily. 08/01/12 08/01/13 Yes Chelle S Jeffery, PA-C  beclomethasone (QVAR) 40 MCG/ACT inhaler Inhale 2 puffs into the lungs 2 (two) times daily. 07/31/12 07/31/13 Yes Chelle S Jeffery, PA-C  buPROPion (WELLBUTRIN XL) 150 MG 24 hr tablet Take 150 mg by mouth daily.   Yes Historical Provider, MD  escitalopram (LEXAPRO) 20 MG tablet Take 1 tablet (20 mg total) by mouth daily. 05/21/12  Yes Chelle Tessa Lerner, PA-C  levonorgestrel-ethinyl estradiol (AVIANE,ALESSE,LESSINA) 0.1-20 MG-MCG tablet Take 1 tablet by mouth daily. 05/21/12  Yes Chelle S Jeffery, PA-C  Multiple Vitamin (MULTIVITAMIN) tablet Take 1 tablet by mouth daily.   Yes Historical Provider, MD  SUMAtriptan (IMITREX) 100 MG tablet Take 100 mg by mouth every 2 (two) hours as needed.   Yes Historical Provider, MD  traZODone (DESYREL) 100 MG tablet Take 1 tablet (100 mg total) by mouth at bedtime. 05/21/12  Yes Chelle Tessa Lerner, PA-C    Allergies  Allergen Reactions  . Penicillins Anaphylaxis    History  Social History  . Marital Status: Married    Spouse Name: Chrissie Noa    Number of Children: 1  . Years of Education: N/A   Occupational History  . 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 Smoking status: Never Smoker   . Smokeless tobacco: Never Used  . Alcohol Use: No  . Drug Use: No  . Sexually Active: Yes -- Female partner(s)    Birth Control/ Protection: Pill   Other Topics Concern  . Not on file   Social History Narrative  . No  narrative on file    Family History  Problem Relation Age of Onset  . Hyperlipidemia Father   . Heart disease Father     stenting  . Migraines Father   . Hypertension Father   . Mental illness Brother     anxiety/depression  . Migraines Daughter   . Allergies Daughter   . Cancer Mother     breast  . Mental illness Mother     anxiety  . Hyperlipidemia Mother   . Breast cancer Mother   . Stroke Maternal Grandmother   . Hypertension Maternal Grandmother   . Hyperlipidemia Maternal Grandfather   . Cancer Paternal Grandmother     uterine  . Migraines Paternal Grandmother   . Heart disease Paternal Grandfather   . Cancer Paternal Grandfather    Objective:   Physical Exam  Nursing note and vitals reviewed. Constitutional: She is oriented to person, place, and time. She appears well-developed and well-nourished. No distress.  HENT:  Mouth/Throat: Oropharynx is clear and moist.  Neck: Normal range of motion. Neck supple. No thyromegaly present.  Cardiovascular: Normal rate, regular rhythm, normal heart sounds and intact distal pulses.   No murmur heard. Pulmonary/Chest: Effort normal and breath sounds normal. No respiratory distress. She has no wheezes. She has no rales.  Abdominal: Soft. Bowel sounds are normal. She exhibits no distension and no mass. There is no hepatosplenomegaly. There is tenderness in the right lower quadrant and left lower quadrant. There is guarding. There is no rigidity, no rebound and no CVA tenderness.  Lymphadenopathy:    She has no cervical adenopathy.  Neurological: She is alert and oriented to person, place, and time. No cranial nerve deficit. She exhibits normal muscle tone. Coordination normal.  Skin: Skin is warm and dry. No rash noted. She is not diaphoretic.  Psychiatric: She has a normal mood and affect. Her behavior is normal.        Results for orders placed in visit on 11/06/12  POCT CBC      Component Value Range   WBC 8.8  4.6 -  10.2 K/uL   Lymph, poc 1.6  0.6 - 3.4   POC LYMPH PERCENT 18.2  10 - 50 %L   MID (cbc) 0.7  0 - 0.9   POC MID % 8.4  0 - 12 %M   POC Granulocyte 6.5  2 - 6.9   Granulocyte percent 73.4  37 - 80 %G   RBC 4.38  4.04 - 5.48 M/uL   Hemoglobin 12.0 (*) 12.2 - 16.2 g/dL   HCT, POC 16.1  09.6 - 47.9 %   MCV 89.9  80 - 97 fL   MCH, POC 27.4  27 - 31.2 pg   MCHC 30.5 (*) 31.8 - 35.4 g/dL   RDW, POC 04.5     Platelet Count, POC 330  142 - 424 K/uL  MPV 8.9  0 - 99.8 fL    TORADOL 60MG  IM ADMINISTERED DURING VISIT.  ZOFRAN 8MG  PO ADMINISTERED IN OFFICE.    IV PLACED WITH INFILTRATION OF L LINE.  IV REMOVED.   Assessment & Plan:   1. Nausea  POCT UA - Microscopic Only, POCT urinalysis dipstick, POCT CBC, Comprehensive metabolic panel, Stool culture, Clostridium difficile EIA, CT Abdomen Pelvis W Contrast  2. Vomiting  POCT UA - Microscopic Only, POCT urinalysis dipstick, POCT CBC, Comprehensive metabolic panel, Stool culture, Clostridium difficile EIA, CT Abdomen Pelvis W Contrast  3. Diarrhea  POCT UA - Microscopic Only, POCT urinalysis dipstick, POCT CBC, Comprehensive metabolic panel, Stool culture, Clostridium difficile EIA, CT Abdomen Pelvis W Contrast  4. Migraine  ketorolac (TORADOL) injection 60 mg     1. Gastroenteritis:  Persistent; duration 5 days with mucous containing stools. Obtain stool cultures.  Send for CT abd/pelvis to evaluate for diverticulitis, colitis.  Obtain labs.  Continue BRAT diet, hydration.  Continue Zofran and Phenergan PRN. 2. Migraine:  New.  S/p Toradol injection in office.  Normal neurological exam.  Spoke with Dr. [name] Payor after receiving CT abd/pelvis results (diffuse colitis worse at cecum, appendix).  Agree with treatment with Cipro, Flagyl.  Cecum inflammation/colitis concerning for C. Difficile.     Prolonged face-to-face:  30 minutes additional to office visit.  Meds ordered this encounter  Medications  . ketorolac (TORADOL) injection 60 mg     Sig:   . ondansetron (ZOFRAN-ODT) disintegrating tablet 4 mg    Sig:   . ondansetron (ZOFRAN-ODT) disintegrating tablet 4 mg    Sig:   . ciprofloxacin (CIPRO) 500 MG tablet    Sig: Take 1 tablet (500 mg total) by mouth 2 (two) times daily.    Dispense:  10 tablet    Refill:  0  . metroNIDAZOLE (FLAGYL) 500 MG tablet    Sig: Take 1 tablet (500 mg total) by mouth 3 (three) times daily. DO NOT CONSUME ALCOHOL WHILE TAKING THIS MEDICATION.    Dispense:  30 tablet    Refill:  0

## 2012-11-07 LAB — CLOSTRIDIUM DIFFICILE EIA: CDIFTX: NEGATIVE

## 2012-11-07 LAB — URINE CULTURE: Colony Count: 5000

## 2012-11-08 ENCOUNTER — Other Ambulatory Visit: Payer: Self-pay | Admitting: Emergency Medicine

## 2012-11-09 ENCOUNTER — Other Ambulatory Visit: Payer: Self-pay | Admitting: Emergency Medicine

## 2012-11-19 ENCOUNTER — Encounter: Payer: Self-pay | Admitting: Internal Medicine

## 2012-11-19 ENCOUNTER — Telehealth: Payer: Self-pay | Admitting: Physician Assistant

## 2012-11-19 ENCOUNTER — Other Ambulatory Visit: Payer: Self-pay | Admitting: Physician Assistant

## 2012-11-19 ENCOUNTER — Telehealth: Payer: Self-pay | Admitting: Internal Medicine

## 2012-11-19 DIAGNOSIS — K529 Noninfective gastroenteritis and colitis, unspecified: Secondary | ICD-10-CM

## 2012-11-19 NOTE — Telephone Encounter (Signed)
Patient is having worsening diarrhea and pain.  See the notation on CT scan from 11/10/12.  Discussed with Dr. Katrinka Blazing today.  The patient will come in tomorrow and see Amy Esterwood PA at 9:30.  They have not repeated cdiff, she has been on cipro and flagyl.

## 2012-11-19 NOTE — Telephone Encounter (Signed)
Contacted office to inquire about getting this patient seen sooner than 12/26/2012 for her recurrent diarrhea.  She was recently seen for colitis, and while she improved, her symptoms recurred over this past weekend.    I was advised that there is not an available appointment sooner than 12/17/2011, but that a nurse will call me back to discuss the situation.

## 2012-11-20 ENCOUNTER — Other Ambulatory Visit: Payer: 59

## 2012-11-20 ENCOUNTER — Ambulatory Visit (INDEPENDENT_AMBULATORY_CARE_PROVIDER_SITE_OTHER): Payer: 59 | Admitting: Physician Assistant

## 2012-11-20 ENCOUNTER — Other Ambulatory Visit (INDEPENDENT_AMBULATORY_CARE_PROVIDER_SITE_OTHER): Payer: 59

## 2012-11-20 ENCOUNTER — Encounter: Payer: Self-pay | Admitting: Physician Assistant

## 2012-11-20 VITALS — BP 112/68 | HR 105 | Ht 64.25 in | Wt 159.4 lb

## 2012-11-20 DIAGNOSIS — R109 Unspecified abdominal pain: Secondary | ICD-10-CM

## 2012-11-20 DIAGNOSIS — R197 Diarrhea, unspecified: Secondary | ICD-10-CM

## 2012-11-20 LAB — CBC WITH DIFFERENTIAL/PLATELET
Basophils Absolute: 0 K/uL (ref 0.0–0.1)
Basophils Relative: 0.3 % (ref 0.0–3.0)
Eosinophils Absolute: 0.4 K/uL (ref 0.0–0.7)
Eosinophils Relative: 3 % (ref 0.0–5.0)
HCT: 36.6 % (ref 36.0–46.0)
Hemoglobin: 12.1 g/dL (ref 12.0–15.0)
Lymphocytes Relative: 8.4 % — ABNORMAL LOW (ref 12.0–46.0)
Lymphs Abs: 1.2 K/uL (ref 0.7–4.0)
MCHC: 33 g/dL (ref 30.0–36.0)
MCV: 86.3 fl (ref 78.0–100.0)
Monocytes Absolute: 0.7 K/uL (ref 0.1–1.0)
Monocytes Relative: 4.8 % (ref 3.0–12.0)
Neutro Abs: 12.2 K/uL — ABNORMAL HIGH (ref 1.4–7.7)
Neutrophils Relative %: 83.5 % — ABNORMAL HIGH (ref 43.0–77.0)
Platelets: 383 K/uL (ref 150.0–400.0)
RBC: 4.24 Mil/uL (ref 3.87–5.11)
RDW: 14.5 % (ref 11.5–14.6)
WBC: 14.6 K/uL — ABNORMAL HIGH (ref 4.5–10.5)

## 2012-11-20 LAB — BASIC METABOLIC PANEL WITH GFR
BUN: 6 mg/dL (ref 6–23)
CO2: 23 meq/L (ref 19–32)
Calcium: 8.5 mg/dL (ref 8.4–10.5)
Chloride: 102 meq/L (ref 96–112)
Creatinine, Ser: 0.9 mg/dL (ref 0.4–1.2)
GFR: 74.7 mL/min
Glucose, Bld: 93 mg/dL (ref 70–99)
Potassium: 3.5 meq/L (ref 3.5–5.1)
Sodium: 136 meq/L (ref 135–145)

## 2012-11-20 MED ORDER — GLYCOPYRROLATE 2 MG PO TABS: 2.0000 mg | ORAL_TABLET | Freq: Two times a day (BID) | ORAL | Status: DC

## 2012-11-20 MED ORDER — VANCOMYCIN 50 MG/ML ORAL SOLUTION: 250.0000 mg | Freq: Four times a day (QID) | ORAL | Status: DC

## 2012-11-20 MED ORDER — SACCHAROMYCES BOULARDII 250 MG PO CAPS: 250.0000 mg | ORAL_CAPSULE | Freq: Two times a day (BID) | ORAL | Status: DC

## 2012-11-20 NOTE — Patient Instructions (Addendum)
Please go to the basement level to have your labs drawn.   We sent prescriptions to [facility]. They should be ready at 2 PM. Vancomycin liquid, Florastor, and Robinul forte.

## 2012-11-20 NOTE — Progress Notes (Signed)
Subjective:    Patient ID: Rebecca Bentley, female    DOB: 08-13-1961, 51 y.o.   MRN: [id number]  HPI Naida is a very nice 51 year old white female known to Dr. [name] Payor from screening colonoscopy done in September of 2013. This was at negative exam with the exception of an 8 mm tubular adenoma. Patient comes in now with complaints of diarrhea onset about 1 month ago. She had taken a course of clindamycin for a dental infection early in November, and says that her sense of symptoms started on Thanksgiving day to She started having lower, pain cramping and diarrhea which has persisted ever since. She was seen by primary care and had stool for C. difficile toxin done which was negative and also had a CT scan of the abdomen and pelvis on 11/06/2012 which did show a diffuse colitis with bowel wall thickening including thickening of the appendix there were some small nodes in the right upper quadrant and some stranding around the colon as well . She was started on a course of Cipro and Flagyl that day and says that she took Cipro for 5 days and completed a 10 day course of Flagyl and  really did not have any significant improvement in her symptoms. She says she had one day where she thought she was a little bit better . At this point she is having multiple stools per day which are watery and urgent and having nocturnal episodes of diarrhea as well. She says the night before last she was up 4 times during the night and last night was up twice with diarrhea. She has been taking Imodium during the day and trying not to eat or drink because it aggravates the diarrhea. She has noted scant amount of bright red blood on the tissue she thinks from irritation. She's not had any documented fever or chills she has had some mild nausea no vomiting and says that her abdomen feels somewhat distended. She feels "rundown" but has been trying to work, her weight is down 3 or 4 pounds.    Review of Systems  Constitutional: Positive  for appetite change, fatigue and unexpected weight change.  HENT: Negative.   Eyes: Negative.   Respiratory: Negative.   Cardiovascular: Negative.   Gastrointestinal: Positive for nausea, abdominal pain, diarrhea and abdominal distention.  Genitourinary: Negative.   Musculoskeletal: Negative.   Neurological: Negative.   Hematological: Negative.   Psychiatric/Behavioral: Negative.    Outpatient Prescriptions Prior to Visit  Medication Sig Dispense Refill  . ALPRAZolam (XANAX) 0.5 MG tablet Take 1 tablet (0.5 mg total) by mouth 3 (three) times daily as needed.  90 tablet  0  . atorvastatin (LIPITOR) 20 MG tablet Take 1 tablet (20 mg total) by mouth daily.  90 tablet  3  . beclomethasone (QVAR) 40 MCG/ACT inhaler Inhale 2 puffs into the lungs 2 (two) times daily.  1 Inhaler  12  . buPROPion (WELLBUTRIN XL) 150 MG 24 hr tablet Take 150 mg by mouth daily.      . ciprofloxacin (CIPRO) 500 MG tablet Take 1 tablet (500 mg total) by mouth 2 (two) times daily.  10 tablet  0  . escitalopram (LEXAPRO) 20 MG tablet Take 1 tablet (20 mg total) by mouth daily.  90 tablet  3  . levonorgestrel-ethinyl estradiol (AVIANE,ALESSE,LESSINA) 0.1-20 MG-MCG tablet Take 1 tablet by mouth daily.  3 Package  3  . metroNIDAZOLE (FLAGYL) 500 MG tablet Take 1 tablet (500 mg total) by mouth 3 (three) times  daily. DO NOT CONSUME ALCOHOL WHILE TAKING THIS MEDICATION.  30 tablet  0  . Multiple Vitamin (MULTIVITAMIN) tablet Take 1 tablet by mouth daily.      . SUMAtriptan (IMITREX) 100 MG tablet Take 100 mg by mouth every 2 (two) hours as needed.      . traZODone (DESYREL) 100 MG tablet Take 1 tablet (100 mg total) by mouth at bedtime.  90 tablet  3   Last reviewed on 11/20/2012 10:49 AM by Sammuel Cooper, PA  Allergies  Allergen Reactions  . Penicillins Anaphylaxis   Patient Active Problem List  Diagnosis  . Migraine  . Anisocoria  . RAD (reactive airway disease)  . Hyperlipidemia  . Anxiety  . Insomnia  .  Personal history of colonic polyps-adenoma   History  Substance Use Topics  . Smoking status: Never Smoker   . Smokeless tobacco: Never Used  . Alcohol Use: No   Family History  Problem Relation Age of Onset  . Hyperlipidemia Father   . Heart disease Father     stenting  . Migraines Father   . Hypertension Father   . Mental illness Brother     anxiety/depression  . Migraines Daughter   . Allergies Daughter   . Cancer Mother     breast  . Mental illness Mother     anxiety  . Hyperlipidemia Mother   . Breast cancer Mother   . Stroke Maternal Grandmother   . Hypertension Maternal Grandmother   . Hyperlipidemia Maternal Grandfather   . Cancer Paternal Grandmother     uterine  . Migraines Paternal Grandmother   . Heart disease Paternal Grandfather   . Cancer Paternal Grandfather        Objective:   Physical Exam well-developed white female in no acute distress, fatigued-appearing blood pressure 112/68 pulse 105 height 5 foot 4 weight 159. HEENT nontraumatic normocephalic EOMI PERRLA sclera anicteric, Supple no JVD, Cardiovascular somewhat tachycardia regular rhythm with S1-S2 no murmur or gallop, Pulmonary clear bilaterally, Abdomen mildly distended bowel sounds are hypoactive with occasional tinkle she has tenderness primarily in the left mid quadrant and left lower quadrant no guarding or rebound no palpable mass or hepatosplenomegaly, Rectal exam not done, Extremities no clubbing cyanosis or edema skin warm and dry, Psych mood and affect normal and appropriate        Assessment & Plan:  #29 51 year old female with 1 month history of persistent watery diarrhea, associated with generalized fatigue lower abdominal pain cramping and some distention. Onset of symptoms after a course of clindamycin. I suspect she does have C. difficile despite a negative C. difficile toxin on 11/06/2012. She is likely resistant to Flagyl . #2 mild ileus secondary to above  Plan; CBC with  differential, BMEt and stool for C. 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 She needs to push oral fluid and a clear to full liquid diet Start oral vancomycin 250 mg by mouth 4 times daily x2 weeks Start florastor  by mouth twice daily x2 weeks Start Robinul Forte 2 mg one twice daily as needed for cramping and spasm Have asked her to avoid Imodium if possible as this can delay clearing of C. Difficile Further plans pending results of labs etc., will need to see her back in the office within the next 2 weeks

## 2012-11-20 NOTE — Telephone Encounter (Signed)
Contacted by Dr. Marvell Fuller office on 11/19/12; offered appointment with provider on 11/20/12 at 9:30. Dr. [name] Payor in procedures all day on 11/20/12, but another provider will see her.  Patient advised of appointment time on 11/19/12.  KMS

## 2012-11-21 LAB — CLOSTRIDIUM DIFFICILE BY PCR: Toxigenic C. Difficile by PCR: DETECTED — CR

## 2012-11-21 NOTE — Progress Notes (Signed)
Agree with Ms. Esterwood's assessment and plan. Carl E. Gessner, MD, FACG   

## 2012-11-25 ENCOUNTER — Encounter: Payer: Self-pay | Admitting: Physician Assistant

## 2012-11-25 ENCOUNTER — Ambulatory Visit (INDEPENDENT_AMBULATORY_CARE_PROVIDER_SITE_OTHER): Payer: 59 | Admitting: Physician Assistant

## 2012-11-25 VITALS — BP 138/82 | HR 120 | Temp 98.6°F

## 2012-11-25 DIAGNOSIS — R110 Nausea: Secondary | ICD-10-CM

## 2012-11-25 DIAGNOSIS — E860 Dehydration: Secondary | ICD-10-CM

## 2012-11-25 DIAGNOSIS — A0472 Enterocolitis due to Clostridium difficile, not specified as recurrent: Secondary | ICD-10-CM

## 2012-11-25 DIAGNOSIS — R05 Cough: Secondary | ICD-10-CM

## 2012-11-25 MED ORDER — ONDANSETRON HCL 4 MG/2ML IJ SOLN
4.0000 mg | Freq: Once | INTRAMUSCULAR | Status: AC
  Administered 2012-11-25: 4 mg via INTRAVENOUS

## 2012-11-25 MED ORDER — IPRATROPIUM BROMIDE 0.03 % NA SOLN: 2.0000 | Freq: Two times a day (BID) | NASAL | Status: DC

## 2012-11-25 MED ORDER — ONDANSETRON HCL 4 MG/2ML IJ SOLN: 2.0000 mg | Freq: Once | INTRAMUSCULAR | Status: DC

## 2012-11-25 MED ORDER — HYDROCOD POLST-CHLORPHEN POLST 10-8 MG/5ML PO LQCR: 5.0000 mL | Freq: Two times a day (BID) | ORAL | Status: DC | PRN

## 2012-11-25 MED ORDER — PROMETHAZINE HCL 25 MG PO TABS: 25.0000 mg | ORAL_TABLET | Freq: Three times a day (TID) | ORAL | Status: DC | PRN

## 2012-11-25 NOTE — Progress Notes (Signed)
Subjective:    Patient ID: Rebecca Bentley, female    DOB: Mar 13, 1961, 51 y.o.   MRN: [id number]  HPI This 51 y.o. female presents for evaluation of persistent abdominal cramping and watery diarrhea.  Recall that symptoms began at the end of November, and were preceeded by a course of clindamycin. CT of the abdomen and pelvis revealed diffuse colitis. She was treated with Cipro and Flagyl, and initial C. Diff was negative.  She had some improvement, then worsening again about a week ago and was seen somewhat urgently by GI.  She was thought to have C. Diff colitis that was Flagyl resistant, and a mild ileus.  Repeat C. Diff testing was positive.  She is now on day 6 of 14 PO Vancomycin.  She notes reduced mucous in the stools, and increased time between episodes of diarrhea, which otherwise remains watery and profuse.  Abdominal cramping is minimally improved. She has almost no appetite, and hasn't eaten much.  She's trying to stay hydrated, but presents today concerned that she needs more fluids than she's been able to take in.  No fever, chills.  Nausea is much better and now only episodic.  She has developed post-nasal drainage and a cough.  No ear fullness or pain.  Scratchy throat.  No HA, dizziness, unexplained myalgias, arthralgias, rash.  Generally feels bad.  Past Medical History  Diagnosis Date  . Migraine   . Hyperlipidemia   . Anxiety   . Anisocoria   . RAD (reactive airway disease)   . Allergy     Past Surgical History  Procedure Date  . Refractive surgery 2007    Dr. Nile Riggs    Prior to Admission medications   Medication Sig Start Date End Date Taking? Authorizing Provider  ALPRAZolam Prudy Feeler) 0.5 MG tablet Take 1 tablet (0.5 mg total) by mouth 3 (three) times daily as needed. 07/31/12  Yes Brooksie Ellwanger S Khambrel Amsden, PA-C  atorvastatin (LIPITOR) 20 MG tablet Take 1 tablet (20 mg total) by mouth daily. 08/01/12 08/01/13 Yes Daisee Centner S Youssouf Shipley, PA-C  beclomethasone (QVAR) 40 MCG/ACT inhaler  Inhale 2 puffs into the lungs 2 (two) times daily. 07/31/12 07/31/13 Yes Rumor Sun S Cheila Wickstrom, PA-C  buPROPion (WELLBUTRIN XL) 150 MG 24 hr tablet Take 150 mg by mouth daily.   Yes Historical Provider, MD  escitalopram (LEXAPRO) 20 MG tablet Take 1 tablet (20 mg total) by mouth daily. 05/21/12  Yes Frederica Chrestman S Lucero Ide, PA-C  glycopyrrolate (ROBINUL) 2 MG tablet Take 1 tablet (2 mg total) by mouth 2 (two) times daily. 11/20/12  Yes Amy S Esterwood, PA  levonorgestrel-ethinyl estradiol (AVIANE,ALESSE,LESSINA) 0.1-20 MG-MCG tablet Take 1 tablet by mouth daily. 05/21/12  Yes Caton Popowski S Lama Narayanan, PA-C  Multiple Vitamin (MULTIVITAMIN) tablet Take 1 tablet by mouth daily.   Yes Historical Provider, MD  saccharomyces boulardii (FLORASTOR) 250 MG capsule Take 1 capsule (250 mg total) by mouth 2 (two) times daily. 11/20/12  Yes Amy S Esterwood, PA  SUMAtriptan (IMITREX) 100 MG tablet Take 100 mg by mouth every 2 (two) hours as needed.   Yes Historical Provider, MD  traZODone (DESYREL) 100 MG tablet Take 1 tablet (100 mg total) by mouth at bedtime. 05/21/12  Yes Teliah Buffalo S Cedra Villalon, PA-C  vancomycin (VANCOCIN) 50 mg/mL oral solution Take 5 mLs (250 mg total) by mouth every 6 (six) hours. 11/20/12  Yes Amy Oswald Hillock, PA    Allergies  Allergen Reactions  . Penicillins Anaphylaxis    History   Social History  .  Marital Status: Married    Spouse Name: Chrissie Noa    Number of Children: 1  . Years of Education: N/A   Occupational History  . 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 Smoking status: Never Smoker   . Smokeless tobacco: Never Used  . Alcohol Use: No  . Drug Use: No  . Sexually Active: Yes -- Female partner(s)    Birth Control/ Protection: Pill   Family History  Problem Relation Age of Onset  . Hyperlipidemia Father   . Heart disease Father     stenting  . Migraines Father   . Hypertension Father   . Mental illness Brother     anxiety/depression  . Migraines  Daughter   . Allergies Daughter   . Cancer Mother     breast  . Mental illness Mother     anxiety  . Hyperlipidemia Mother   . Breast cancer Mother   . Stroke Maternal Grandmother   . Hypertension Maternal Grandmother   . Hyperlipidemia Maternal Grandfather   . Cancer Paternal Grandmother     uterine  . Migraines Paternal Grandmother   . Heart disease Paternal Grandfather   . Cancer Paternal Grandfather     Review of Systems As above.    Objective:   Physical Exam Blood pressure 138/82, pulse 120, temperature 98.6 F (37 C), temperature source Oral, last menstrual period 11/06/2012, SpO2 97.00%. There is no height or weight on file to calculate BMI. Well-developed, well nourished WF who is awake, alert and oriented, in NAD, but who obviously feels bad. HEENT: [place]/AT, PERRL, EOMI.  Sclera and conjunctiva are clear.  EAC are patent, TMs are normal in appearance. Nasal mucosa is pink and dry. OP is dry. Neck: supple, non-tender, no lymphadenopathy, thyromegaly. Heart: tachycardic rate, regular rhythm, no murmur Lungs: normal effort, CTA Abdomen: somewhat increased bowel sounds, supple, diffusely tender, no mass or organomegaly. Extremities: no cyanosis, clubbing or edema. Skin: warm and dry without rash. Psychologic: good mood and appropriate affect, normal speech and behavior.  3 liters of NS given by IV, along with 4 mg IV ondasetron.      Assessment & Plan:   1. Nausea  ondansetron (ZOFRAN) injection 4 mg, promethazine (PHENERGAN) 25 MG tablet, DISCONTINUED: ondansetron (ZOFRAN) injection 2 mg  2. Colitis, Clostridium difficile  Continue medications prescribed by GI, and F/U per their recommendations.  3. Cough  chlorpheniramine-HYDROcodone (TUSSIONEX PENNKINETIC ER) 10-8 MG/5ML LQCR, ipratropium (ATROVENT) 0.03 % nasal spray, DISCONTINUED: ipratropium (ATROVENT) 0.03 % nasal spray

## 2012-11-25 NOTE — Patient Instructions (Signed)
Drink, drink, drink. Try to get some calories, and advance your diet as you are able. Rest. And drink. And rest.

## 2012-11-28 ENCOUNTER — Encounter: Payer: Self-pay | Admitting: Physician Assistant

## 2012-11-28 ENCOUNTER — Ambulatory Visit (INDEPENDENT_AMBULATORY_CARE_PROVIDER_SITE_OTHER): Payer: 59 | Admitting: Physician Assistant

## 2012-11-28 DIAGNOSIS — R110 Nausea: Secondary | ICD-10-CM

## 2012-11-28 MED ORDER — ONDANSETRON 4 MG PO TBDP
8.0000 mg | ORAL_TABLET | Freq: Once | ORAL | Status: AC
  Administered 2012-11-28: 8 mg via ORAL

## 2012-11-28 NOTE — Progress Notes (Signed)
  Subjective:    Patient ID: Rebecca Bentley, female    DOB: 1961/10/10, 51 y.o.   MRN: [id number]  HPI This 51 y.o. female presents for evaluation of nausea.  She has been ill since late November, and ultimately diagnosed with C. Diff colitis.  She's now on oral vancomycin and is improving.  A few set backs, requiring IV hydration. Today has had increased nausea.  She has phenergan at home, but it makes her too sleepy to work.  She has used ondansetron previously with good results.   Review of Systems No fever, chills.      Objective:   Physical Exam  A&O x 3. Normal respiratory effort.      Assessment & Plan:   1. Nausea  ondansetron (ZOFRAN-ODT) disintegrating tablet 8 mg   Continue other treatments as before.

## 2012-12-01 ENCOUNTER — Telehealth: Payer: Self-pay | Admitting: Internal Medicine

## 2012-12-01 ENCOUNTER — Other Ambulatory Visit: Payer: Self-pay | Admitting: Physician Assistant

## 2012-12-01 MED ORDER — BUPROPION HCL ER (XL) 150 MG PO TB24: 150.0000 mg | ORAL_TABLET | Freq: Every day | ORAL | Status: DC

## 2012-12-01 MED ORDER — VANCOMYCIN 50 MG/ML ORAL SOLUTION: 250.0000 mg | Freq: Four times a day (QID) | ORAL | Status: DC

## 2012-12-01 NOTE — Telephone Encounter (Signed)
Find out status of symptoms - if diarrhea resolved no Tx If still w/ sxs then refill

## 2012-12-01 NOTE — Telephone Encounter (Signed)
Patient still having loose stools so per Dr. [name] Payor phoned in refill on her Vancomycin.  She is completely out.  They said it may be tomorrow before its ready, they will call her if ready today.  She will keep her upcoming appointment with [place].

## 2012-12-01 NOTE — Telephone Encounter (Signed)
Patient coming to see you 12/04/2012.  Saw Amy 11/20/12 and started the Vancomycin.  Would you like me to refill or you will decide when seen 12/04/12.  Thanks Sir.

## 2012-12-04 ENCOUNTER — Ambulatory Visit (INDEPENDENT_AMBULATORY_CARE_PROVIDER_SITE_OTHER): Payer: 59 | Admitting: Internal Medicine

## 2012-12-04 ENCOUNTER — Encounter: Payer: Self-pay | Admitting: Internal Medicine

## 2012-12-04 VITALS — BP 116/76 | HR 100 | Ht 64.0 in | Wt 159.1 lb

## 2012-12-04 DIAGNOSIS — A0472 Enterocolitis due to Clostridium difficile, not specified as recurrent: Secondary | ICD-10-CM

## 2012-12-04 MED ORDER — VANCOMYCIN 50 MG/ML ORAL SOLUTION: 250.0000 mg | Freq: Four times a day (QID) | ORAL | Status: AC

## 2012-12-04 NOTE — Patient Instructions (Addendum)
Take the vancomycin for 21 days total and then stop. I suggest taking Florastor 250 mg 2 daily for 1 month after the vancomycin completed and if you need antibiotics in the future I would take it while on them.  Let me know if any further questions - you can call and leave a message with my RN.  Thank you for choosing me and  Gastroenterology.  Iva Boop, MD, [facility]

## 2012-12-04 NOTE — Progress Notes (Signed)
  Subjective:    Patient ID: Rebecca Bentley, female    DOB: 1961/01/18, 52 y.o.   MRN: [id number]  HPI The patient returns in followup after having seen a physician assistant, Ms. Monica Becton. She was diagnosed with C. difficile colitis. After 2 weeks of vancomycin she was still having some loose stools and she called so I refilled the vancomycin prior to this appointment. In the last day or so stools have firmed up in fact she has not had a bowel movement a couple of days. She has some residual abdominal discomfort but is better. She has some fatigue. She also has noted an increase in heartburn symptoms currently staying set of heartburn symptoms and last couple of weeks. Medications, allergies, past medical history, past surgical history, family history and social history are reviewed and updated in the EMR.   Review of Systems As above    Objective:   Physical Exam Well-developed well-nourished no acute distress Abdomen soft with some mild lower quadrant tenderness bilaterally       Assessment & Plan:   1. C. difficile colitis most likely from clindamycin use   2. Heartburn  1. She will take a total of 21 days of vancomycin and then stop. 2. I explained the possibility of postinfectious IBS. She will monitor for things over time. 3. I'm not sure why she is having heartburn problems right now, perhaps as a side effect of her medication. We have decided to observe and use antacids as needed. PPI therapy has been associated with C. differential so we'll try to avoid that right now. 4. She will call back or be seen as needed.  CC: JEFFERY,CHELLE, PA-C

## 2012-12-05 ENCOUNTER — Ambulatory Visit
Admission: RE | Admit: 2012-12-05 | Discharge: 2012-12-05 | Disposition: A | Discharge status: Home / Self Care | Payer: 59 | Source: Ambulatory Visit | Attending: Physician Assistant | Admitting: Physician Assistant

## 2012-12-05 DIAGNOSIS — Z803 Family history of malignant neoplasm of breast: Secondary | ICD-10-CM

## 2012-12-05 DIAGNOSIS — Z1231 Encounter for screening mammogram for malignant neoplasm of breast: Secondary | ICD-10-CM

## 2012-12-05 LAB — HM MAMMOGRAPHY: HM Mammogram: NORMAL

## 2012-12-16 ENCOUNTER — Ambulatory Visit: Payer: 59 | Admitting: Internal Medicine

## 2012-12-24 ENCOUNTER — Telehealth: Payer: Self-pay | Admitting: Internal Medicine

## 2012-12-24 MED ORDER — VANCOMYCIN 50 MG/ML ORAL SOLUTION: 125.0000 mg | Freq: Four times a day (QID) | ORAL | Status: DC

## 2012-12-24 NOTE — Telephone Encounter (Signed)
I have sent in RX to [facility] and called them to compound the Vanc today.  I have left a message for her to call back to discuss

## 2012-12-24 NOTE — Telephone Encounter (Signed)
Left message for patient to call back  

## 2012-12-24 NOTE — Telephone Encounter (Signed)
Vancomycin 125 mg qid x 2 weeks Rx - will need to get follow-up by phone or in office next week as I anticipate needing a taper or Xifaxan chaser to try to get rid of this.  Ask her to give [place] an update in 48 hrs  Careful use of loperamide ok

## 2012-12-24 NOTE — Telephone Encounter (Signed)
Patient aware, she will call back on Friday with an update

## 2012-12-24 NOTE — Telephone Encounter (Signed)
Patient reports that she finished her vancomycin 2 weeks ago.  She is having soft semi formed stools yesterday that have progressed to diarrhea today.  Yellow watery this am.  6 stools since last night.  Pain and cramping.  No fever.  She does work in a MDs office, but feels that her symptoms are consistent with her previous c diff infection. Please advise

## 2012-12-30 NOTE — Telephone Encounter (Signed)
Assuming she is better Need an update on progress please

## 2012-12-30 NOTE — Telephone Encounter (Signed)
She reports that she is doing much better.  "still discolored, but not diarrhea".  Please advise if needs to do anything additional

## 2012-12-31 ENCOUNTER — Other Ambulatory Visit: Payer: Self-pay | Admitting: Physician Assistant

## 2012-12-31 MED ORDER — PREDNISONE 20 MG PO TABS: ORAL_TABLET | ORAL | Status: DC

## 2012-12-31 NOTE — Progress Notes (Signed)
Patient with two recent episodes of C diff. Currently taking vancomycin. Now with URI symptoms. Taking OTC supportive care as well as her inhaler, atrovent nasal, and left over cough medication. She is trying to do everything possible to stay off of antibiotics. I listen to her lungs today and outside of mild mild expiratory wheezing bilaterally they are clear to auscultation bilaterally. Will proceed with addition of prednisone today.   Eula Listen, PA-C 12/31/2012 8:29 AM

## 2013-01-05 MED ORDER — VANCOMYCIN 50 MG/ML ORAL SOLUTION: ORAL | Status: DC

## 2013-01-05 NOTE — Addendum Note (Signed)
Addended by: Annett Fabian on: 01/05/2013 11:42 AM   Modules accepted: Orders

## 2013-01-05 NOTE — Telephone Encounter (Signed)
Patient aware.  She will call back for any additional questions or concerns

## 2013-01-05 NOTE — Telephone Encounter (Signed)
Needs vancomycin taper  125 mg vancomycin After qid x 2 weeks completes do:  1) bid x 5 days 2) qd x 5 days 3) qod x 5 days and stop  Florastor 250 bid now and for 1 month after completion Also anytime using Abx take Florastor   If recurrent problems please call

## 2013-02-13 ENCOUNTER — Ambulatory Visit (INDEPENDENT_AMBULATORY_CARE_PROVIDER_SITE_OTHER): Payer: 59 | Admitting: Physician Assistant

## 2013-02-13 VITALS — BP 138/82 | HR 78 | Temp 98.7°F | Resp 16 | Ht 64.0 in | Wt 159.0 lb

## 2013-02-13 DIAGNOSIS — R51 Headache: Secondary | ICD-10-CM

## 2013-02-13 MED ORDER — PREDNISONE 20 MG PO TABS: ORAL_TABLET | ORAL | Status: DC

## 2013-02-13 MED ORDER — SUMATRIPTAN SUCCINATE 100 MG PO TABS: 100.0000 mg | ORAL_TABLET | ORAL | Status: DC | PRN

## 2013-02-13 MED ORDER — KETOROLAC TROMETHAMINE 60 MG/2ML IM SOLN
60.0000 mg | Freq: Once | INTRAMUSCULAR | Status: AC
  Administered 2013-02-13: 60 mg via INTRAMUSCULAR

## 2013-02-13 NOTE — Progress Notes (Signed)
Patient ID: KEYRI SALBERG MRN: [id number], DOB: [birth year], 52 y.o. Date of Encounter: 02/13/2013, 2:34 PM  Primary Physician: JEFFERY,CHELLE, PA-C  Chief Complaint: Headache x 3 days  HPI: 52 y.o. year old female with history below presents with typical migraine headache for 3 days. Headache is located along the occipital region and radiates down into the neck and shoulders. Typical distribution for her. She also notes some nasal congestion, rhinorrhea, and sinus congestion and has had an upper respiratory infection for a week or so. Afebrile. No chills. No unusual signs or symptoms. No numbness or tingling. No nausea or vomiting. No weakness or slurred speech. No aura. Some photophobia and phonophobia. She has tried an Imitrex and Flexeril without relief; however that is typical for her.     Past Medical History  Diagnosis Date  . Migraine   . Hyperlipidemia   . Anxiety   . Anisocoria   . RAD (reactive airway disease)   . Allergy   . Clostridium difficile colitis      Home Meds: Prior to Admission medications   Medication Sig Start Date End Date Taking? Authorizing Provider  ALPRAZolam Prudy Feeler) 0.5 MG tablet Take 1 tablet (0.5 mg total) by mouth 3 (three) times daily as needed. 07/31/12  Yes Chelle S Jeffery, PA-C  atorvastatin (LIPITOR) 20 MG tablet Take 1 tablet (20 mg total) by mouth daily. 08/01/12 08/01/13 Yes Chelle S Jeffery, PA-C  beclomethasone (QVAR) 40 MCG/ACT inhaler Inhale 2 puffs into the lungs 2 (two) times daily as needed. 07/31/12 07/31/13 Yes Chelle S Jeffery, PA-C  buPROPion (WELLBUTRIN XL) 150 MG 24 hr tablet Take 1 tablet (150 mg total) by mouth daily. 12/01/12  Yes Chelle S Jeffery, PA-C  escitalopram (LEXAPRO) 20 MG tablet Take 1 tablet (20 mg total) by mouth daily. 05/21/12  Yes Chelle S Jeffery, PA-C  ipratropium (ATROVENT) 0.03 % nasal spray Place 2 sprays into the nose 2 (two) times daily as needed. 11/25/12  Yes Chelle Tessa Lerner, PA-C  levonorgestrel-ethinyl  estradiol (AVIANE,ALESSE,LESSINA) 0.1-20 MG-MCG tablet Take 1 tablet by mouth daily. 05/21/12  Yes Chelle S Jeffery, PA-C  Multiple Vitamin (MULTIVITAMIN) tablet Take 1 tablet by mouth daily.   Yes Historical Provider, MD  promethazine (PHENERGAN) 25 MG tablet Take 1 tablet (25 mg total) by mouth every 8 (eight) hours as needed for nausea. 11/25/12  Yes Chelle S Jeffery, PA-C  SUMAtriptan (IMITREX) 100 MG tablet Take 1 tablet (100 mg total) by mouth every 2 (two) hours as needed. 02/13/13  Yes Itzayanna Kaster M Roni Scow, PA-C  traZODone (DESYREL) 100 MG tablet Take 1 tablet (100 mg total) by mouth at bedtime. 05/21/12  Yes Chelle Tessa Lerner, PA-C           Allergies:  Allergies  Allergen Reactions  . Penicillins Anaphylaxis  . Clindamycin/Lincomycin Diarrhea    History   Social History  . Marital Status: Married    Spouse Name: Chrissie Noa    Number of Children: 1  . Years of Education: N/A   Occupational History  . RN, Clinical Team Manager     [employer]   Social History Main Topics  . Smoking status: Never Smoker   . Smokeless tobacco: Never Used  . Alcohol Use: No  . Drug Use: No  . Sexually Active: Yes -- Female partner(s)    Birth Control/ Protection: Pill   Other Topics Concern  . Not on file   Social History Narrative  . No narrative on file  Review of Systems: Constitutional: negative for chills, fever, night sweats, weight changes, or fatigue  HEENT: positive for nasal congestion, rhinorrhea, and sinus pressure. negative for vision changes, hearing loss, ST, or epistaxis Cardiovascular: negative for chest pain or palpitations Respiratory: negative for hemoptysis, wheezing, shortness of breath, or cough Abdominal: negative for abdominal pain, nausea, vomiting, or diarrhea Dermatological: negative for rash Neurologic: positive for headache. negative for dizziness, or syncope   Physical Exam: Blood pressure 138/82, pulse 78, temperature 98.7 F (37.1 C), resp. rate 16,  height 5\' 4"  (1.626 m), weight 159 lb (72.122 kg), last menstrual period 01/29/2013., Body mass index is 27.28 kg/(m^2). General: Well developed, well nourished, in no acute distress. Head: Normocephalic, atraumatic, eyes without discharge, sclera non-icteric, nares are without discharge. Bilateral auditory canals clear, TM's are without perforation, pearly grey and translucent with reflective cone of light bilaterally. Mild maxillary sinus TTP. Oral cavity moist, posterior pharynx without exudate, erythema, peritonsillar abscess, or post nasal drip. Uvula midline.   Neck: Supple. No thyromegaly. Full ROM. No lymphadenopathy. Lungs: Clear bilaterally to auscultation without wheezes, rales, or rhonchi. Breathing is unlabored. Heart: RRR with S1 S2. No murmurs, rubs, or gallops appreciated. Msk:  Strength and tone normal for age. Extremities/Skin: Warm and dry. No clubbing or cyanosis. No edema. No rashes or suspicious lesions. Neuro: Alert and oriented X 3. Moves all extremities spontaneously. Gait is normal. CNII-XII grossly in tact. DTR 2+. Normal cerebellar function.  Psych:  Responds to questions appropriately with a normal affect.     ASSESSMENT AND PLAN:  52 y.o. female with typical migraine and sinus congestion -Toradol 60 mg IM in office -Will avoid antibiotic therapy given her recent episode of C diff over the fall/winter -Prednisone 20 mg 3x3, 2x3, 1x3 #18 no RF -Refilled Imitrex 100 mg Take one at the onset of a migraine, may repeat in 2 hours if needed #10 RF 6 -RTC prn   Signed, Eula Listen, PA-C 02/13/2013 2:34 PM

## 2013-02-25 ENCOUNTER — Other Ambulatory Visit: Payer: Self-pay | Admitting: Radiology

## 2013-02-25 ENCOUNTER — Ambulatory Visit (INDEPENDENT_AMBULATORY_CARE_PROVIDER_SITE_OTHER): Payer: 59 | Admitting: Family Medicine

## 2013-02-25 ENCOUNTER — Ambulatory Visit: Payer: 59

## 2013-02-25 VITALS — BP 138/76 | HR 86 | Temp 97.7°F | Resp 18 | Ht 64.0 in | Wt 159.0 lb

## 2013-02-25 DIAGNOSIS — R05 Cough: Secondary | ICD-10-CM

## 2013-02-25 DIAGNOSIS — R059 Cough, unspecified: Secondary | ICD-10-CM

## 2013-02-25 DIAGNOSIS — J387 Other diseases of larynx: Secondary | ICD-10-CM

## 2013-02-25 DIAGNOSIS — K219 Gastro-esophageal reflux disease without esophagitis: Secondary | ICD-10-CM

## 2013-02-25 LAB — POCT CBC
Granulocyte percent: 60.8 %G (ref 37–80)
Hemoglobin: 12.8 g/dL (ref 12.2–16.2)
Lymph, poc: 2.8 (ref 0.6–3.4)
MCHC: 30.5 g/dL — AB (ref 31.8–35.4)
MPV: 8.6 fL (ref 0–99.8)
POC Granulocyte: 5.2 (ref 2–6.9)
POC MID %: 6.4 %M (ref 0–12)
RDW, POC: 15.3 %

## 2013-02-25 MED ORDER — HYDROCOD POLST-CHLORPHEN POLST 10-8 MG/5ML PO LQCR: 5.0000 mL | Freq: Two times a day (BID) | ORAL | Status: DC | PRN

## 2013-02-25 MED ORDER — RANITIDINE HCL 150 MG PO TABS: 150.0000 mg | ORAL_TABLET | Freq: Two times a day (BID) | ORAL | Status: DC

## 2013-02-25 MED ORDER — ALBUTEROL SULFATE HFA 108 (90 BASE) MCG/ACT IN AERS: 2.0000 | INHALATION_SPRAY | RESPIRATORY_TRACT | Status: DC | PRN

## 2013-02-25 NOTE — Progress Notes (Signed)
Patient ID: Rebecca Bentley MRN: [id number], DOB: 1960/12/30, 52 y.o. Date of Encounter: 02/25/2013, 5:22 PM  Primary Physician: Porfirio Oar, PA-C  Chief Complaint:  Chief Complaint  Patient presents with  . Cough    persistant for 2 weeks    HPI: 52 y.o. female presents with a 14 day history of nasal congestion, post nasal drip, sinus pressure, and cough. Afebrile. Some chills. Nasal congestion thick and green/yellow. Cough is productive in the morning, however throughout the day the cough is nonproductive. Some shortness of breath and wheezing. Sore from coughing. Ears feel full. Since her episode of C diff over the fall/winter she has dealt with reflux. This is a new issue for her. She has been taking Tums off and on for this, but this has not helped. Has tried OTC cold preps without success. No GI complaints. Multiple sick contacts at work. No recent antibiotics or recent travels. No leg trauma, sedentary periods, h/o cancer, or tobacco use.   Past Medical History  Diagnosis Date  . Migraine   . Hyperlipidemia   . Anxiety   . Anisocoria   . RAD (reactive airway disease)   . Allergy   . Clostridium difficile colitis      Home Meds: Prior to Admission medications   Medication Sig Start Date End Date Taking? Authorizing Provider  ALPRAZolam Prudy Feeler) 0.5 MG tablet Take 1 tablet (0.5 mg total) by mouth 3 (three) times daily as needed. 07/31/12  Yes Chelle S Jeffery, PA-C  atorvastatin (LIPITOR) 20 MG tablet Take 1 tablet (20 mg total) by mouth daily. 08/01/12 08/01/13 Yes Chelle S Jeffery, PA-C  beclomethasone (QVAR) 40 MCG/ACT inhaler Inhale 2 puffs into the lungs 2 (two) times daily as needed. 07/31/12 07/31/13 Yes Chelle S Jeffery, PA-C  buPROPion (WELLBUTRIN XL) 150 MG 24 hr tablet Take 1 tablet (150 mg total) by mouth daily. 12/01/12  Yes Chelle S Jeffery, PA-C  escitalopram (LEXAPRO) 20 MG tablet Take 1 tablet (20 mg total) by mouth daily. 05/21/12  Yes Chelle S Jeffery, PA-C    ipratropium (ATROVENT) 0.03 % nasal spray Place 2 sprays into the nose 2 (two) times daily as needed. 11/25/12  Yes Chelle Tessa Lerner, PA-C  levonorgestrel-ethinyl estradiol (AVIANE,ALESSE,LESSINA) 0.1-20 MG-MCG tablet Take 1 tablet by mouth daily. 05/21/12  Yes Chelle S Jeffery, PA-C  Multiple Vitamin (MULTIVITAMIN) tablet Take 1 tablet by mouth daily.   Yes Historical Provider, MD  SUMAtriptan (IMITREX) 100 MG tablet Take 1 tablet (100 mg total) by mouth every 2 (two) hours as needed. 02/13/13  Yes Torri Langston M Laurice Iglesia, PA-C  traZODone (DESYREL) 100 MG tablet Take 1 tablet (100 mg total) by mouth at bedtime. 05/21/12  Yes Chelle S Jeffery, PA-C  predniSONE (DELTASONE) 20 MG tablet 3 PO FOR 2 DAYS, 2 PO FOR 2 DAYS, 1 PO FOR 2 DAYS 02/13/13  No Zanyiah Posten M Kenyetta Fife, PA-C  promethazine (PHENERGAN) 25 MG tablet Take 1 tablet (25 mg total) by mouth every 8 (eight) hours as needed for nausea. 11/25/12   Chelle Tessa Lerner, PA-C    Allergies:  Allergies  Allergen Reactions  . Penicillins Anaphylaxis  . Clindamycin/Lincomycin Diarrhea    History   Social History  . Marital Status: Married    Spouse Name: Chrissie Noa    Number of Children: 1  . Years of Education: N/A   Occupational History  . 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 Smoking status: Never Smoker   .  Smokeless tobacco: Never Used  . Alcohol Use: No  . Drug Use: No  . Sexually Active: Yes -- Female partner(s)    Birth Control/ Protection: Pill   Other Topics Concern  . Not on file   Social History Narrative  . No narrative on file     Review of Systems: Constitutional: positive for chills. negative for fever, night sweats or weight changes HEENT: see above Cardiovascular: negative for chest pain or palpitations Respiratory: positive for cough, shortness of breath, and wheezing. negative for hemoptysis Abdominal: negative for abdominal pain, nausea, vomiting or diarrhea Dermatological: negative for  rash Neurologic: negative for headache   Physical Exam: Blood pressure 138/76, pulse 86, temperature 97.7 F (36.5 C), resp. rate 18, height 5\' 4"  (1.626 m), weight 159 lb (72.122 kg), last menstrual period 01/29/2013, SpO2 99.00%., Body mass index is 27.28 kg/(m^2). General: Well developed, well nourished, in no acute distress. Head: Normocephalic, atraumatic, eyes without discharge, sclera non-icteric, nares are congested. Bilateral auditory canals clear, TM's are without perforation, pearly grey with reflective cone of light bilaterally. Frontal sinus TTP. Oral cavity moist, dentition normal. Posterior pharynx with post nasal drip and mild erythema. No peritonsillar abscess or tonsillar exudate. Uvula midline.  Neck: Supple. No thyromegaly. Full ROM. No lymphadenopathy. Lungs: Clear to auscultation bilaterally without wheezes, rales, or rhonchi. Breathing is unlabored.  Heart: RRR with S1 S2. No murmurs, rubs, or gallops appreciated. Msk:  Strength and tone normal for age. Extremities: No clubbing or cyanosis. No edema. Neuro: Alert and oriented X 3. Moves all extremities spontaneously. CNII-XII grossly in tact. Psych:  Responds to questions appropriately with a normal affect.   Labs: Results for orders placed in visit on 02/25/13  POCT CBC      Result Value Range   WBC 8.6  4.6 - 10.2 K/uL   Lymph, poc 2.8  0.6 - 3.4   POC LYMPH PERCENT 32.8  10 - 50 %L   MID (cbc) 0.6  0 - 0.9   POC MID % 6.4  0 - 12 %M   POC Granulocyte 5.2  2 - 6.9   Granulocyte percent 60.8  37 - 80 %G   RBC 4.65  4.04 - 5.48 M/uL   Hemoglobin 12.8  12.2 - 16.2 g/dL   HCT, POC 16.1  09.6 - 47.9 %   MCV 90.4  80 - 97 fL   MCH, POC 27.5  27 - 31.2 pg   MCHC 30.5 (*) 31.8 - 35.4 g/dL   RDW, POC 04.5     Platelet Count, POC 381  142 - 424 K/uL   MPV 8.6  0 - 99.8 fL   CXR [facility] reading (PRIMARY) by  Dr. Neva Seat. Faint increased RLL markings.    ASSESSMENT AND PLAN:  52 y.o. year old female with cough  secondary to LPR  -Zantac 150 mg 1 po bid #60 RF 3 -Tussionex 1 tsp po q 12 hours prn cough #90 mL no RF -Proventil 2 puffs inhaled q 4-6 hours prn #1 RF 1 -Mucinex -Tylenol/Motrin prn -Rest/fluids -RTC precautions -RTC 3-5 days if no improvement  Signed, Eula Listen, PA-C 02/25/2013 5:22 PM

## 2013-02-25 NOTE — Telephone Encounter (Signed)
Called Tussionex for patient

## 2013-02-25 NOTE — Progress Notes (Signed)
Xray read and patient discussed with Mr. Rebecca Bentley. Agree with assessment and plan of care per his note. XR report noted:  CHEST - 2 VIEW  Comparison: PA and lateral chest 04/27/2011.  Findings: Minimal linear atelectasis is seen in the lung bases.  Heart size normal. No pneumothorax or pleural effusion.  IMPRESSION:  Minimal basilar atelectasis.

## 2013-04-17 ENCOUNTER — Ambulatory Visit: Payer: 59

## 2013-04-17 ENCOUNTER — Ambulatory Visit (INDEPENDENT_AMBULATORY_CARE_PROVIDER_SITE_OTHER): Payer: 59 | Admitting: Family Medicine

## 2013-04-17 VITALS — BP 126/78 | HR 90 | Temp 98.4°F | Resp 16 | Ht 64.0 in | Wt 160.8 lb

## 2013-04-17 DIAGNOSIS — R079 Chest pain, unspecified: Secondary | ICD-10-CM

## 2013-04-17 DIAGNOSIS — M940 Chondrocostal junction syndrome [Tietze]: Secondary | ICD-10-CM

## 2013-04-17 DIAGNOSIS — Z3009 Encounter for other general counseling and advice on contraception: Secondary | ICD-10-CM

## 2013-04-17 MED ORDER — NAPROXEN 500 MG PO TABS: 500.0000 mg | ORAL_TABLET | Freq: Two times a day (BID) | ORAL | Status: DC

## 2013-04-17 MED ORDER — LEVONORGESTREL-ETHINYL ESTRAD 0.1-20 MG-MCG PO TABS: 1.0000 | ORAL_TABLET | Freq: Every day | ORAL | Status: DC

## 2013-04-17 NOTE — Progress Notes (Signed)
Xray read and patient discussed with Eula Listen, PA-C. Agree with assessment and plan of care per his note.  Radiology report: Findings: A marker was placed over patient's right lower anterior  rib pain. There is no evidence of acute rib fracture. Remainder  of the exam is unchanged.  IMPRESSION:  No acute rib fracture.

## 2013-04-17 NOTE — Progress Notes (Signed)
Patient ID: Rebecca Bentley MRN: [id number], DOB: 1961/11/30, 52 y.o. Date of Encounter: 04/17/2013, 4:34 PM  Primary Physician: JEFFERY,CHELLE, PA-C  Chief Complaint: Chest wall pain and medication refill   HPI: 52 y.o. female with history below presents with 1.5 month history of right chest wall pain located along her 10th rib. Pain is worst if she coughs or pushes over the area. She is sore from just medial of the nipple line to the mid axillary line. No associated SOB, wheezing, chest tightness, nausea, vomiting, diaphoresis, neck, arm, or shoulder pain. Pain does not radiate. Pain at baseline is about a 5/10. When she pushes on it the pain becomes about a 10/10. It is easily reproducible by pushing on her chest wall. She has been taking Aleave and notes this helps considerably. Increased stress at work lately. Father with CAD.   She also requests a refill of her OCP. Not due for follow up until August.      Past Medical History  Diagnosis Date  . Migraine   . Hyperlipidemia   . Anxiety   . Anisocoria   . RAD (reactive airway disease)   . Allergy   . Clostridium difficile colitis      Home Meds: Prior to Admission medications   Medication Sig Start Date End Date Taking? Authorizing Provider  albuterol (PROVENTIL HFA;VENTOLIN HFA) 108 (90 BASE) MCG/ACT inhaler Inhale 2 puffs into the lungs every 4 (four) hours as needed for wheezing. 02/25/13  Yes Ethleen Lormand M Constantin Hillery, PA-C  ALPRAZolam Prudy Feeler) 0.5 MG tablet Take 1 tablet (0.5 mg total) by mouth 3 (three) times daily as needed. 07/31/12  Yes Chelle S Jeffery, PA-C  atorvastatin (LIPITOR) 20 MG tablet Take 1 tablet (20 mg total) by mouth daily. 08/01/12 08/01/13 Yes Chelle S Jeffery, PA-C  buPROPion (WELLBUTRIN XL) 150 MG 24 hr tablet Take 1 tablet (150 mg total) by mouth daily. 12/01/12  Yes Chelle S Jeffery, PA-C  escitalopram (LEXAPRO) 20 MG tablet Take 1 tablet (20 mg total) by mouth daily. 05/21/12  Yes Chelle S Jeffery, PA-C  ipratropium  (ATROVENT) 0.03 % nasal spray Place 2 sprays into the nose 2 (two) times daily as needed. 11/25/12  Yes Chelle Tessa Lerner, PA-C  levonorgestrel-ethinyl estradiol (AVIANE,ALESSE,LESSINA) 0.1-20 MG-MCG tablet Take 1 tablet by mouth daily. 04/17/13  Yes Lucas Exline M Jahlen Bollman, PA-C  Multiple Vitamin (MULTIVITAMIN) tablet Take 1 tablet by mouth daily.   Yes Historical Provider, MD  ranitidine (ZANTAC) 150 MG tablet Take 1 tablet (150 mg total) by mouth 2 (two) times daily. 02/25/13  Yes Adir Schicker M Lyrah Bradt, PA-C  SUMAtriptan (IMITREX) 100 MG tablet Take 1 tablet (100 mg total) by mouth every 2 (two) hours as needed. 02/13/13  Yes Tate Jerkins M Romi Rathel, PA-C  traZODone (DESYREL) 100 MG tablet Take 1 tablet (100 mg total) by mouth at bedtime. 05/21/12  Yes Chelle S Jeffery, PA-C  beclomethasone (QVAR) 40 MCG/ACT inhaler Inhale 2 puffs into the lungs 2 (two) times daily as needed. 07/31/12 07/31/13 No Chelle S Jeffery, PA-C  chlorpheniramine-HYDROcodone (TUSSIONEX PENNKINETIC ER) 10-8 MG/5ML LQCR Take 5 mLs by mouth every 12 (twelve) hours as needed. 02/25/13  No Reyanne Hussar M Sybrina Laning, PA-C         predniSONE (DELTASONE) 20 MG tablet 3 PO FOR 2 DAYS, 2 PO FOR 2 DAYS, 1 PO FOR 2 DAYS 02/13/13  No Zaira Iacovelli M Sujata Maines, PA-C  promethazine (PHENERGAN) 25 MG tablet Take 1 tablet (25 mg total) by mouth every 8 (eight) hours as needed  for nausea. 11/25/12  No Chelle Tessa Lerner, PA-C    Allergies:  Allergies  Allergen Reactions  . Penicillins Anaphylaxis  . Clindamycin/Lincomycin Diarrhea    History   Social History  . Marital Status: Married    Spouse Name: Chrissie Noa    Number of Children: 1  . Years of Education: N/A   Occupational History  . 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 Smoking status: Never Smoker   . Smokeless tobacco: Never Used  . Alcohol Use: No  . Drug Use: No  . Sexually Active: Yes -- Female partner(s)    Birth Control/ Protection: Pill   Other Topics Concern  . Not on file   Social History  Narrative  . No narrative on file     Review of Systems: Constitutional: negative for chills, fever, night sweats, weight changes, or fatigue  Cardiovascular: positive for chest pain. negative for edema, DOE, tachycardia, diaphoresis, or palpitations Respiratory: negative for hemoptysis, wheezing, shortness of breath, or cough Abdominal: negative for abdominal pain, nausea, vomiting, or diarrhea Dermatological: negative for rash Neurologic: negative for headache, dizziness, or syncope All other systems reviewed and are otherwise negative with the exception to those above and in the HPI.   Physical Exam: Blood pressure 126/78, pulse 90, temperature 98.4 F (36.9 C), temperature source Oral, resp. rate 16, height 5\' 4"  (1.626 m), weight 160 lb 12.8 oz (72.938 kg), last menstrual period 03/27/2013, SpO2 98.00%., Body mass index is 27.59 kg/(m^2). General: Well developed, well nourished, in no acute distress. Well appearing.  Head: Normocephalic, atraumatic, eyes without discharge, sclera non-icteric, nares are without discharge.   Neck: Supple. Full ROM.  Lungs: Clear bilaterally to auscultation without wheezes, rales, or rhonchi. Breathing is unlabored. Heart: RRR with S1 S2. No murmurs, rubs, or gallops appreciated. Chest: Palpation from just medial of nipple line to mid-axillary line over anterior portion of 10th rib reproduces patient's pain. Deep inspiration does don't reproduce pain. Coughing reproduces pain.    Abdomen: Soft, non-tender, non-distended with normoactive bowel sounds. No hepatosplenomegaly. Negative Murphy's. No rebound/guarding. No obvious abdominal masses. Negative McBurney's.  Msk:  Strength and tone normal for age. Extremities/Skin: Warm and dry. No clubbing or cyanosis. No edema. No rashes or suspicious lesions. Neuro: Alert and oriented X 3. Moves all extremities spontaneously. Gait is normal. CNII-XII grossly in tact. Psych:  Responds to questions appropriately  with a normal affect.   CXR with ribs: [facility] reading (PRIMARY) by  Dr. Neva Seat. Possible calcified rib fracture distal portion of right 10th rib, otherwise negative   ASSESSMENT AND PLAN:  52 y.o. female with possible occult rib fracture vs costochondritis and here for OCP refill  1) Possible occult rib fracture vs costochondritis -Naprosyn 500 mg 1 po bid prn #60 RF 3 -Rest -Discussed with patient treatment options with Naprosyn vs Mobic. Will go ahead and treat with Naprosyn at this time. Advised this is harsher on the stomach than Mobic. She will need Zantac or PPI for entire treatment course. If reflux or cough returns she may need to change NSAID. Patient understands and agrees.   2) OCP refill -Refilled OCP 1 po daily #30 RF 3   Signed, Eula Listen, PA-C 04/17/2013 4:34 PM

## 2013-05-15 ENCOUNTER — Ambulatory Visit (INDEPENDENT_AMBULATORY_CARE_PROVIDER_SITE_OTHER): Payer: 59 | Admitting: Physician Assistant

## 2013-05-15 VITALS — BP 138/92 | HR 99 | Temp 98.6°F | Resp 16 | Ht 64.0 in | Wt 164.8 lb

## 2013-05-15 DIAGNOSIS — F411 Generalized anxiety disorder: Secondary | ICD-10-CM

## 2013-05-15 DIAGNOSIS — R51 Headache: Secondary | ICD-10-CM

## 2013-05-15 DIAGNOSIS — R05 Cough: Secondary | ICD-10-CM

## 2013-05-15 DIAGNOSIS — J069 Acute upper respiratory infection, unspecified: Secondary | ICD-10-CM

## 2013-05-15 MED ORDER — ESCITALOPRAM OXALATE 20 MG PO TABS: 20.0000 mg | ORAL_TABLET | Freq: Every day | ORAL | Status: DC

## 2013-05-15 MED ORDER — TRAZODONE HCL 100 MG PO TABS: 100.0000 mg | ORAL_TABLET | Freq: Every day | ORAL | Status: DC

## 2013-05-15 MED ORDER — KETOROLAC TROMETHAMINE 60 MG/2ML IM SOLN
60.0000 mg | Freq: Once | INTRAMUSCULAR | Status: AC
  Administered 2013-05-15: 60 mg via INTRAMUSCULAR

## 2013-05-15 MED ORDER — HYDROCODONE-HOMATROPINE 5-1.5 MG/5ML PO SYRP: ORAL_SOLUTION | ORAL | Status: DC

## 2013-05-15 MED ORDER — PREDNISONE 20 MG PO TABS: ORAL_TABLET | ORAL | Status: DC

## 2013-05-15 NOTE — Progress Notes (Signed)
Patient ID: Rebecca Bentley MRN: [id number], DOB: [birth year], 52 y.o. Date of Encounter: 05/15/2013, 1:15 PM  Primary Physician: JEFFERY,CHELLE, PA-C  Chief Complaint: URI/headache  HPI: 52 y.o. female with history below presents with 2 week history of waxing and waning headache coupled with nasal congestion, sinus pressure, and cough. Subjective fever and chills. Cough is mildly productive and worse at nighttime, keeping her awake. She has felt mildly SOB today when walking between the two buildings at work, but otherwise no SOB, wheezing, or chest pain. Headache is mostly along her sinuses and secondary to her sinus pressure, however she does feel like she is starting to get a migraine as well. No unusual symptoms. Her headaches improve when she is not at work indicating that there is a fair amount of a stress component to them. She will be starting a new job in 3 days, so hopefully this will help with her headaches.    Past Medical History  Diagnosis Date  . Migraine   . Hyperlipidemia   . Anxiety   . Anisocoria   . RAD (reactive airway disease)   . Allergy   . Clostridium difficile colitis      Home Meds: Prior to Admission medications   Medication Sig Start Date End Date Taking? Authorizing Provider  albuterol (PROVENTIL HFA;VENTOLIN HFA) 108 (90 BASE) MCG/ACT inhaler Inhale 2 puffs into the lungs every 4 (four) hours as needed for wheezing. 02/25/13  Yes Kashmere Staffa M Ayanni Tun, PA-C  ALPRAZolam Prudy Feeler) 0.5 MG tablet Take 1 tablet (0.5 mg total) by mouth 3 (three) times daily as needed. 07/31/12  Yes Chelle S Jeffery, PA-C  atorvastatin (LIPITOR) 20 MG tablet Take 1 tablet (20 mg total) by mouth daily. 08/01/12 08/01/13 Yes Chelle S Jeffery, PA-C  beclomethasone (QVAR) 40 MCG/ACT inhaler Inhale 2 puffs into the lungs 2 (two) times daily as needed. 07/31/12 07/31/13 Yes Chelle S Jeffery, PA-C  buPROPion (WELLBUTRIN XL) 150 MG 24 hr tablet Take 1 tablet (150 mg total) by mouth daily. 12/01/12  Yes  Chelle S Jeffery, PA-C  escitalopram (LEXAPRO) 20 MG tablet Take 1 tablet (20 mg total) by mouth daily. 05/15/13  Yes Keirstyn Aydt M Jadarius Commons, PA-C  ipratropium (ATROVENT) 0.03 % nasal spray Place 2 sprays into the nose 2 (two) times daily as needed. 11/25/12  Yes Chelle Tessa Lerner, PA-C  levonorgestrel-ethinyl estradiol (AVIANE,ALESSE,LESSINA) 0.1-20 MG-MCG tablet Take 1 tablet by mouth daily. 04/17/13  Yes Johnatha Zeidman M Shayona Hibbitts, PA-C  Multiple Vitamin (MULTIVITAMIN) tablet Take 1 tablet by mouth daily.   Yes Historical Provider, MD  naproxen (NAPROSYN) 500 MG tablet Take 1 tablet (500 mg total) by mouth 2 (two) times daily with a meal. 04/17/13  Yes Fayrene Towner M Rahiem Schellinger, PA-C  ranitidine (ZANTAC) 150 MG tablet Take 1 tablet (150 mg total) by mouth 2 (two) times daily. 02/25/13  Yes Donnette Macmullen M Maysin Carstens, PA-C  SUMAtriptan (IMITREX) 100 MG tablet Take 1 tablet (100 mg total) by mouth every 2 (two) hours as needed. 02/13/13  Yes Janalynn Eder M Laquida Cotrell, PA-C  traZODone (DESYREL) 100 MG tablet Take 1 tablet (100 mg total) by mouth at bedtime. 05/15/13  Yes Kiran Carline Adria Devon, PA-C                  Allergies:  Allergies  Allergen Reactions  . Penicillins Anaphylaxis  . Clindamycin/Lincomycin Diarrhea    History   Social History  . Marital Status: Married    Spouse Name: Chrissie Noa    Number of Children: 1  .  Years of Education: N/A   Occupational History  . 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 Smoking status: Never Smoker   . Smokeless tobacco: Never Used  . Alcohol Use: No  . Drug Use: No  . Sexually Active: Yes -- Female partner(s)    Birth Control/ Protection: Pill   Other Topics Concern  . Not on file   Social History Narrative  . No narrative on file     Review of Systems: Constitutional: negative for chills, fever, or fatigue  HEENT: see above Cardiovascular: negative for chest pain or palpitations Respiratory: positive for cough and SOB. negative for hemoptysis, wheezing, dyspnea, and  tachypnea Abdominal: negative for abdominal pain, nausea, vomiting, or diarrhea Dermatological: negative for rash Neurologic: positive for headache. negative for dizziness, or syncope   Physical Exam: Blood pressure 138/92, pulse 99, temperature 98.6 F (37 C), temperature source Oral, resp. rate 16, height 5\' 4"  (1.626 m), weight 164 lb 12.8 oz (74.753 kg), last menstrual period 04/25/2013, SpO2 98.00%., Body mass index is 28.27 kg/(m^2). General: Well developed, well nourished, in no acute distress. Head: Normocephalic, atraumatic, eyes without discharge, sclera non-icteric, nares are without discharge. Bilateral auditory canals clear, TM's are without perforation, pearly grey and translucent with reflective cone of light bilaterally. Palpation over the maxillary sinuses relieves pressure. Oral cavity moist, posterior pharynx without exudate, erythema, peritonsillar abscess, or post nasal drip.   Neck: Supple. No thyromegaly. Full ROM. No lymphadenopathy. Lungs: Clear bilaterally to auscultation without wheezes, rales, or rhonchi. Breathing is unlabored. Heart: RRR with S1 S2. No murmurs, rubs, or gallops appreciated. Msk:  Strength and tone normal for age. Extremities/Skin: Warm and dry. No clubbing or cyanosis. No edema. No rashes or suspicious lesions. Neuro: Alert and oriented X 3. Moves all extremities spontaneously. Gait is normal. CNII-XII grossly in tact. Psych:  Responds to questions appropriately with a normal affect.     ASSESSMENT AND PLAN:  52 y.o. female with URI, headache, and anxiety.   1) URI -Prednisone 20 mg #18 3x3, 2x3, 1x3 no RF -Hycodan #4oz 1 tsp po q 4-6 hours prn cough no RF SED -Mucinex -Rest/fluids  2) Headache -Prednisone 20 mg #18 3x3, 2x3, 1x3 no RF -Toradol 60 mg IM -Secondary to sinus pressure to migraine   3) Anxiety  -Refilled Lexapro 20 mg 1 po daily #90 RF 1 -Refilled Trazadone 100 mg 1 po qhs #90 RF 1    Signed, Eula Listen,  PA-C 05/15/2013 1:15 PM

## 2013-07-31 HISTORY — PX: WISDOM TOOTH EXTRACTION: SHX21

## 2013-10-26 ENCOUNTER — Encounter: Payer: Self-pay | Admitting: Family

## 2013-10-26 ENCOUNTER — Ambulatory Visit (INDEPENDENT_AMBULATORY_CARE_PROVIDER_SITE_OTHER): Payer: 59 | Admitting: Family

## 2013-10-26 VITALS — BP 126/86 | HR 79 | Temp 98.1°F | Resp 16 | Ht 64.5 in | Wt 165.1 lb

## 2013-10-26 DIAGNOSIS — E785 Hyperlipidemia, unspecified: Secondary | ICD-10-CM

## 2013-10-26 DIAGNOSIS — F411 Generalized anxiety disorder: Secondary | ICD-10-CM

## 2013-10-26 DIAGNOSIS — F341 Dysthymic disorder: Secondary | ICD-10-CM

## 2013-10-26 DIAGNOSIS — F329 Major depressive disorder, single episode, unspecified: Secondary | ICD-10-CM

## 2013-10-26 DIAGNOSIS — G43909 Migraine, unspecified, not intractable, without status migrainosus: Secondary | ICD-10-CM

## 2013-10-26 MED ORDER — ESCITALOPRAM OXALATE 20 MG PO TABS: 20.0000 mg | ORAL_TABLET | Freq: Every day | ORAL | Status: DC

## 2013-10-26 MED ORDER — BUPROPION HCL ER (XL) 150 MG PO TB24: 150.0000 mg | ORAL_TABLET | Freq: Every day | ORAL | Status: DC

## 2013-10-26 MED ORDER — ATORVASTATIN CALCIUM 20 MG PO TABS: 20.0000 mg | ORAL_TABLET | Freq: Every day | ORAL | Status: DC

## 2013-10-26 NOTE — Progress Notes (Signed)
Subjective:    Patient ID: Rebecca Bentley, female    DOB: Apr 14, 1961, 52 y.o.   MRN: [id number]  HPI  Rebecca Bentley is a 52 yr old female who presents today to establish care.    Hyperlipidemia-  On cholesterol medication since her early 54's.  Reports total cholesterol was 244 at that time.  She is maintained on atorvastatin- denies myalgia.  Depression/Anxiety- reports that she has a family hx.  Mother, brother, GM aunts all suffer from anxiety.  She is using alprazolam prn.  Uses mostly at bedtime if she has trouble sleeping.  Reports symptoms are well controlled. Reports depression well controlled.  Tried to wean down on wellbutrin and did not feel good. Wishes to continue.   Uses qvar only if she gets sick.  Reports hx of pneumonia in the past.   Uses zantac prn.  Needed more last year during long treatment for C. diff infection.  She was following previously with Rebecca Tinnie Gens PA-c.   Migraines- reports that migraines are less frequent since she is in a lower stress job.  Occur 1-2 x a month. Usually naproxen will take care of HA, if worse, she will use imitrex.    Hx colon polyps- followed by Dr. [name] Bentley.    She reports that she had hx of abnormal bleeding. Stopped OCP in August.  She has not had hot flashes.  No period since stopping ocp.     Review of Systems  Constitutional:       Reports weight has been fluctuating  HENT: Negative for rhinorrhea.   Respiratory: Negative for cough and shortness of breath.   Cardiovascular: Negative for chest pain.  Gastrointestinal: Positive for constipation.  Genitourinary: Negative for dysuria, frequency and menstrual problem.  Musculoskeletal: Negative for arthralgias and myalgias.  Skin:       Sees Rebecca Bentley at [facility] for dermatology has some moles she would like him to look at.   Neurological: Positive for headaches.  Hematological: Negative for adenopathy.  Psychiatric/Behavioral:       See HPI   Past Medical History   Diagnosis Date  . Migraine   . Hyperlipidemia   . Anxiety   . Anisocoria   . RAD (reactive airway disease)   . Allergy   . Clostridium difficile colitis     History   Social History  . Marital Status: Married    Spouse Name: Rebecca Bentley    Number of Children: 1  . Years of Education: N/A   Occupational History  . 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 Smoking status: Never Smoker   . Smokeless tobacco: Never Used  . Alcohol Use: Yes     Comment: occasional  . Drug Use: No  . Sexual Activity: Yes    Partners: Male    Birth Control/ Protection: Pill   Other Topics Concern  . Not on file   Social History Narrative   Married x 25 years, adult daughter lives at home   Has Rebecca Bentley   She is an Charity fundraiser at [employer]          Past Surgical History  Procedure Laterality Date  . Refractive surgery  2007    Dr. Nile Bentley  . Colonoscopy  08/2012  . Wisdom tooth extraction  07/31/13    Family History  Problem Relation Age of Onset  . Hyperlipidemia Father   . Heart disease Father  stenting- age 35  . Migraines Father   . Hypertension Father   . Cancer Father     pancreas  . Diabetes Father   . Mental illness Brother     anxiety/depression  . Migraines Daughter   . Allergies Daughter   . Mental illness Mother     anxiety  . Hyperlipidemia Mother   . Breast cancer Mother   . Stroke Maternal Grandmother   . Hypertension Maternal Grandmother   . Hyperlipidemia Maternal Grandfather   . Heart disease Maternal Grandfather   . Uterine cancer Paternal Grandmother   . Migraines Paternal Grandmother   . Heart disease Paternal Grandfather   . Cancer Paternal Grandfather     Allergies  Allergen Reactions  . Penicillins Anaphylaxis  . Clindamycin/Lincomycin Diarrhea    Current Outpatient Prescriptions on File Prior to Visit  Medication Sig Dispense Refill  . ALPRAZolam (XANAX) 0.5 MG tablet Take 1 tablet  (0.5 mg total) by mouth 3 (three) times daily as needed.  90 tablet  0  . beclomethasone (QVAR) 40 MCG/ACT inhaler Inhale 2 puffs into the lungs 2 (two) times daily as needed.      . Multiple Vitamin (MULTIVITAMIN) tablet Take 1 tablet by mouth daily.      . naproxen (NAPROSYN) 500 MG tablet Take 1 tablet (500 mg total) by mouth 2 (two) times daily with a meal.  60 tablet  3  . SUMAtriptan (IMITREX) 100 MG tablet Take 1 tablet (100 mg total) by mouth every 2 (two) hours as needed.  10 tablet  6  . traZODone (DESYREL) 100 MG tablet Take 1 tablet (100 mg total) by mouth at bedtime.  90 tablet  1   No current facility-administered medications on file prior to visit.    BP 126/86  Pulse 79  Temp(Src) 98.1 F (36.7 C) (Oral)  Resp 16  Ht 5' 4.5" (1.638 m)  Wt 165 lb 1.9 oz (74.898 kg)  BMI 27.92 kg/m2  SpO2 99%  LMP 07/13/2013       Objective:   Physical Exam  Constitutional: She is oriented to person, place, and time. She appears well-developed and well-nourished. No distress.  HENT:  Head: Normocephalic and atraumatic.  Right Ear: Tympanic membrane and ear canal normal.  Left Ear: Tympanic membrane and ear canal normal.  Mouth/Throat: No oropharyngeal exudate, posterior oropharyngeal edema or posterior oropharyngeal erythema.  Cardiovascular: Normal rate and regular rhythm.   No murmur heard. Pulmonary/Chest: Effort normal and breath sounds normal. No respiratory distress. She has no wheezes. She has no rales. She exhibits no tenderness.  Musculoskeletal: She exhibits no edema.  Neurological: She is alert and oriented to person, place, and time.  Psychiatric: She has a normal mood and affect. Her behavior is normal. Judgment and thought content normal.          Assessment & Plan:

## 2013-10-26 NOTE — Progress Notes (Signed)
Pre visit review using our clinic review tool, if applicable. No additional management support is needed unless otherwise documented below in the visit note. 

## 2013-10-26 NOTE — Patient Instructions (Signed)
Please schedule fasting physical after the holidays.  

## 2013-10-27 NOTE — Assessment & Plan Note (Signed)
Controlled with PRN imitrex/naproxen

## 2013-10-27 NOTE — Assessment & Plan Note (Signed)
Maintained on statin. Plan FLP next visit.

## 2013-10-27 NOTE — Assessment & Plan Note (Signed)
This is well controlled on wellbutrin and lexapro. Rarely needing xanax.

## 2013-10-28 ENCOUNTER — Telehealth: Payer: Self-pay | Admitting: *Deleted

## 2013-10-28 DIAGNOSIS — F411 Generalized anxiety disorder: Secondary | ICD-10-CM

## 2013-10-28 MED ORDER — TRAZODONE HCL 100 MG PO TABS: 100.0000 mg | ORAL_TABLET | Freq: Every day | ORAL | Status: DC

## 2013-10-28 NOTE — Telephone Encounter (Signed)
Refill sent. Notified pt. 

## 2013-10-28 NOTE — Telephone Encounter (Signed)
Received message from pt that she is going to need a refill on her Trazodone. Per EPIC, last rx was 05/21/13, #90 x 1 refill which should last until 11/05/13.  Please advise.

## 2013-10-28 NOTE — Telephone Encounter (Signed)
OK to refill #90 x 1 refill.

## 2013-11-10 ENCOUNTER — Ambulatory Visit: Payer: 59 | Admitting: Family Medicine

## 2013-11-20 ENCOUNTER — Other Ambulatory Visit: Payer: Self-pay

## 2013-11-20 DIAGNOSIS — Z1231 Encounter for screening mammogram for malignant neoplasm of breast: Secondary | ICD-10-CM

## 2014-01-05 ENCOUNTER — Ambulatory Visit
Admission: RE | Admit: 2014-01-05 | Discharge: 2014-01-05 | Disposition: A | Discharge status: Home / Self Care | Payer: 59 | Source: Ambulatory Visit

## 2014-01-05 ENCOUNTER — Encounter: Payer: Self-pay | Admitting: Family

## 2014-01-05 ENCOUNTER — Ambulatory Visit (INDEPENDENT_AMBULATORY_CARE_PROVIDER_SITE_OTHER): Payer: 59 | Admitting: Family

## 2014-01-05 ENCOUNTER — Other Ambulatory Visit (HOSPITAL_COMMUNITY)
Admission: RE | Admit: 2014-01-05 | Discharge: 2014-01-05 | Disposition: A | Discharge status: Home / Self Care | Payer: 59 | Source: Ambulatory Visit | Attending: Family | Admitting: Family

## 2014-01-05 VITALS — BP 120/82 | HR 67 | Temp 98.1°F | Resp 16 | Ht 64.5 in | Wt 163.1 lb

## 2014-01-05 DIAGNOSIS — F419 Anxiety disorder, unspecified: Secondary | ICD-10-CM

## 2014-01-05 DIAGNOSIS — Z01419 Encounter for gynecological examination (general) (routine) without abnormal findings: Secondary | ICD-10-CM

## 2014-01-05 DIAGNOSIS — Z0000 Encounter for general adult medical examination without abnormal findings: Secondary | ICD-10-CM | POA: Insufficient documentation

## 2014-01-05 DIAGNOSIS — Z1231 Encounter for screening mammogram for malignant neoplasm of breast: Secondary | ICD-10-CM

## 2014-01-05 DIAGNOSIS — G4700 Insomnia, unspecified: Secondary | ICD-10-CM

## 2014-01-05 LAB — HEPATIC FUNCTION PANEL
ALBUMIN: 4.4 g/dL (ref 3.5–5.2)
ALK PHOS: 119 U/L — AB (ref 39–117)
ALT: 35 U/L (ref 0–35)
AST: 29 U/L (ref 0–37)
BILIRUBIN INDIRECT: 0.2 mg/dL (ref 0.2–1.2)
Bilirubin, Direct: 0.1 mg/dL (ref 0.0–0.3)
TOTAL PROTEIN: 6.6 g/dL (ref 6.0–8.3)
Total Bilirubin: 0.3 mg/dL (ref 0.2–1.2)

## 2014-01-05 LAB — CBC WITH DIFFERENTIAL/PLATELET
Basophils Absolute: 0 10*3/uL (ref 0.0–0.1)
Basophils Relative: 0 % (ref 0–1)
EOS ABS: 0.2 10*3/uL (ref 0.0–0.7)
EOS PCT: 3 % (ref 0–5)
HEMATOCRIT: 41.6 % (ref 36.0–46.0)
Hemoglobin: 13.7 g/dL (ref 12.0–15.0)
LYMPHS ABS: 1.8 10*3/uL (ref 0.7–4.0)
LYMPHS PCT: 23 % (ref 12–46)
MCH: 28.1 pg (ref 26.0–34.0)
MCHC: 32.9 g/dL (ref 30.0–36.0)
MCV: 85.2 fL (ref 78.0–100.0)
MONO ABS: 0.4 10*3/uL (ref 0.1–1.0)
MONOS PCT: 5 % (ref 3–12)
Neutro Abs: 5.2 10*3/uL (ref 1.7–7.7)
Neutrophils Relative %: 69 % (ref 43–77)
PLATELETS: 332 10*3/uL (ref 150–400)
RBC: 4.88 MIL/uL (ref 3.87–5.11)
RDW: 13.9 % (ref 11.5–15.5)
WBC: 7.6 10*3/uL (ref 4.0–10.5)

## 2014-01-05 LAB — BASIC METABOLIC PANEL WITH GFR
BUN: 14 mg/dL (ref 6–23)
CALCIUM: 9.8 mg/dL (ref 8.4–10.5)
CO2: 30 meq/L (ref 19–32)
Chloride: 103 mEq/L (ref 96–112)
Creat: 0.8 mg/dL (ref 0.50–1.10)
GFR, Est African American: >89 mL/min
GFR, Est Non African American: 85 mL/min
Glucose, Bld: 87 mg/dL (ref 70–99)
Potassium: 4.9 mEq/L (ref 3.5–5.3)
SODIUM: 141 meq/L (ref 135–145)

## 2014-01-05 LAB — LIPID PANEL
Cholesterol: 150 mg/dL (ref 0–200)
HDL: 49 mg/dL (ref >39)
LDL CALC: 71 mg/dL (ref 0–99)
Total CHOL/HDL Ratio: 3.1 Ratio
Triglycerides: 152 mg/dL — ABNORMAL HIGH (ref <150)
VLDL: 30 mg/dL (ref 0–40)

## 2014-01-05 MED ORDER — ALPRAZOLAM 0.5 MG PO TABS: 0.5000 mg | ORAL_TABLET | Freq: Three times a day (TID) | ORAL | Status: DC | PRN

## 2014-01-05 NOTE — Assessment & Plan Note (Signed)
Continue healthy diet, exercise.  Obtain fasting lab work.  Pap performed today.

## 2014-01-05 NOTE — Progress Notes (Signed)
Subjective:    Patient ID: Rebecca Bentley, female    DOB: 05-30-1961, 53 y.o.   MRN: [id number]  HPI  Ms. Rebecca Bentley is a 53 yr old female who presents today for cpx.  Patient presents today for complete physical.  Immunizations: up to date Diet: healthy Exercise: started running 2-3 times a week Colonoscopy:  Up to date Dexa: Never Pap Smear: 2012- due, reports LMP was 8/14.  Rare hot flashes Mammogram: up to date    Review of Systems  Constitutional: Negative for unexpected weight change.       Wt Readings from Last 3 Encounters: 01/05/14 : 163 lb 1.9 oz (73.991 kg) 10/26/13 : 165 lb 1.9 oz (74.898 kg) 05/15/13 : 164 lb 12.8 oz (74.753 kg)   HENT: Negative for hearing loss and rhinorrhea.   Eyes: Negative for visual disturbance.  Respiratory: Negative for cough and shortness of breath.   Cardiovascular: Negative for chest pain.  Gastrointestinal: Negative for nausea, vomiting, diarrhea and blood in stool.  Genitourinary: Negative for dysuria, urgency, hematuria and flank pain.  Musculoskeletal: Negative for arthralgias and myalgias.  Skin: Negative for rash.  Neurological:       + HA today, occasional headaches.   Usually subsides with naproxen.  Occasional imitrex use  Hematological: Negative for adenopathy.  Psychiatric/Behavioral:       Dad has stage 4 cancer.  Uses xanax prn sleep   Past Medical History  Diagnosis Date  . Migraine   . Hyperlipidemia   . Anxiety   . Anisocoria   . RAD (reactive airway disease)   . Allergy   . Clostridium difficile colitis     History   Social History  . Marital Status: Married    Spouse Name: Rebecca Bentley    Number of Children: 1  . Years of Education: N/A   Occupational History  . 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 Smoking status: Never Smoker   . Smokeless tobacco: Never Used  . Alcohol Use: Yes     Comment: occasional  . Drug Use: No  . Sexual Activity: Yes    Partners:  Male    Birth Control/ Protection: Pill   Other Topics Concern  . Not on file   Social History Narrative   Married x 25 years, adult daughter lives at home   Has Rebecca Bentley   She is an Therapist, sports at [employer]          Past Surgical History  Procedure Laterality Date  . Refractive surgery  2007    Dr. Gershon Crane  . Colonoscopy  08/2012  . Wisdom tooth extraction  07/31/13    Family History  Problem Relation Age of Onset  . Hyperlipidemia Father   . Heart disease Father     stenting- age 22  . Migraines Father   . Hypertension Father   . Cancer Father     pancreas  . Diabetes Father   . Mental illness Brother     anxiety/depression  . Migraines Daughter   . Allergies Daughter   . Mental illness Mother     anxiety  . Hyperlipidemia Mother   . Breast cancer Mother   . Stroke Maternal Grandmother   . Hypertension Maternal Grandmother   . Hyperlipidemia Maternal Grandfather   . Heart disease Maternal Grandfather   . Uterine cancer Paternal Grandmother   . Migraines Paternal Grandmother   . Heart disease Paternal  Grandfather   . Cancer Paternal Grandfather     Allergies  Allergen Reactions  . Penicillins Anaphylaxis  . Clindamycin/Lincomycin Diarrhea    Current Outpatient Prescriptions on File Prior to Visit  Medication Sig Dispense Refill  . atorvastatin (LIPITOR) 20 MG tablet Take 1 tablet (20 mg total) by mouth daily.  90 tablet  1  . buPROPion (WELLBUTRIN XL) 150 MG 24 hr tablet Take 1 tablet (150 mg total) by mouth daily.  90 tablet  1  . escitalopram (LEXAPRO) 20 MG tablet Take 1 tablet (20 mg total) by mouth daily.  90 tablet  1  . Multiple Vitamin (MULTIVITAMIN) tablet Take 1 tablet by mouth daily.      . naproxen (NAPROSYN) 500 MG tablet Take 1 tablet (500 mg total) by mouth 2 (two) times daily with a meal.  60 tablet  3  . RA KRILL OIL 500 MG CAPS Take 1 capsule by mouth daily.      . ranitidine (ZANTAC) 150 MG tablet Take 150 mg by mouth 2  (two) times daily as needed.      . saccharomyces boulardii (FLORASTOR) 250 MG capsule Take 250 mg by mouth 2 (two) times daily.      . SUMAtriptan (IMITREX) 100 MG tablet Take 1 tablet (100 mg total) by mouth every 2 (two) hours as needed.  10 tablet  6  . traZODone (DESYREL) 100 MG tablet Take 1 tablet (100 mg total) by mouth at bedtime.  90 tablet  1   No current facility-administered medications on file prior to visit.    BP 120/82  Pulse 67  Temp(Src) 98.1 F (36.7 C) (Oral)  Resp 16  Ht 5' 4.5" (1.638 m)  Wt 163 lb 1.9 oz (73.991 kg)  BMI 27.58 kg/m2  SpO2 99%  LMP 07/03/2013        Objective:   Physical Exam  Physical Exam  Constitutional: She is oriented to person, place, and time. She appears well-developed and well-nourished. No distress.  HENT:  Head: Normocephalic and atraumatic.  Right Ear: Tympanic membrane and ear canal normal.  Left Ear: Tympanic membrane and ear canal normal.  Mouth/Throat: Oropharynx is clear and moist.  Eyes: Pupils are equal, round, and reactive to light. No scleral icterus.  Neck: Normal range of motion. No thyromegaly present.  Cardiovascular: Normal rate and regular rhythm.   No murmur heard. Pulmonary/Chest: Effort normal and breath sounds normal. No respiratory distress. He has no wheezes. She has no rales. She exhibits no tenderness.  Abdominal: Soft. Bowel sounds are normal. He exhibits no distension and no mass. There is no tenderness. There is no rebound and no guarding.  Musculoskeletal: She exhibits no edema.  Lymphadenopathy:    She has no cervical adenopathy.  Neurological: She is alert and oriented to person, place, and time. She has normal patellar reflexes. She exhibits normal muscle tone. Coordination normal.  Skin: Skin is warm and dry.  Psychiatric: She has a normal mood and affect. Her behavior is normal. Judgment and thought content normal.  Breasts: Examined lying Right: Without masses, retractions, discharge or  axillary adenopathy.  Left: Without masses, retractions, discharge or axillary adenopathy.  Inguinal/mons: Normal without inguinal adenopathy  External genitalia: Normal  BUS/Urethra/Skene's glands: Normal  Bladder: Normal  Vagina: Normal  Cervix: Normal  Uterus: normal in size, shape and contour. Midline and mobile  Adnexa/parametria:  Rt: Without masses or tenderness.  Lt: Without masses or tenderness.  Anus and perineum: Normal  Assessment & Plan:          Assessment & Plan:

## 2014-01-05 NOTE — Patient Instructions (Signed)
Keep up the good work with healthy diet and exercise! Complete lab work prior to leaving. Schedule bone density at the front desk. Follow up in 6 months.

## 2014-01-06 ENCOUNTER — Telehealth: Payer: Self-pay | Admitting: Family

## 2014-01-06 DIAGNOSIS — R81 Glycosuria: Secondary | ICD-10-CM

## 2014-01-06 DIAGNOSIS — R748 Abnormal levels of other serum enzymes: Secondary | ICD-10-CM

## 2014-01-06 LAB — URINALYSIS, ROUTINE W REFLEX MICROSCOPIC
Bilirubin Urine: NEGATIVE
GLUCOSE, UA: 250 mg/dL — AB
HGB URINE DIPSTICK: NEGATIVE
KETONES UR: NEGATIVE mg/dL
Leukocytes, UA: NEGATIVE
Nitrite: NEGATIVE
Protein, ur: NEGATIVE mg/dL
Specific Gravity, Urine: 1.011 (ref 1.005–1.030)
Urobilinogen, UA: 0.2 mg/dL (ref 0.0–1.0)
pH: 6.5 (ref 5.0–8.0)

## 2014-01-06 LAB — TSH: TSH: 2.473 u[IU]/mL (ref 0.350–4.500)

## 2014-01-06 NOTE — Telephone Encounter (Signed)
Please let pt know that her urine is showing some glucose.   Sugar is normal in blood.  I would like her to repeat UA with micro and A1C in 2 weeks, dx is glucosuria. Alk phos is elevated.   This can be done at [place] if she wants.

## 2014-01-08 NOTE — Telephone Encounter (Signed)
Is UA with micro for microalbumin or micorscopic exam?

## 2014-01-08 NOTE — Telephone Encounter (Signed)
UA with microscopic please.

## 2014-01-08 NOTE — Telephone Encounter (Signed)
Does pt also need to repeat alk phos when completing additional tests?

## 2014-01-09 NOTE — Telephone Encounter (Signed)
Yes, she should also repeat alk phos.  Pap smear is negative but we did not get a good endocervical sample.  This could be related to menopause.  We should repeat pap in 1 year.

## 2014-01-11 NOTE — Telephone Encounter (Signed)
Left message on work voicemail (ext [phone]) to return my call.

## 2014-01-12 NOTE — Telephone Encounter (Signed)
Notified pt and she would like to have tests drawn that the [facility] lab. Future lab orders entered.

## 2014-01-22 ENCOUNTER — Other Ambulatory Visit (INDEPENDENT_AMBULATORY_CARE_PROVIDER_SITE_OTHER): Payer: 59

## 2014-01-22 DIAGNOSIS — R748 Abnormal levels of other serum enzymes: Secondary | ICD-10-CM

## 2014-01-22 DIAGNOSIS — R81 Glycosuria: Secondary | ICD-10-CM

## 2014-01-22 LAB — URINALYSIS, ROUTINE W REFLEX MICROSCOPIC
Bilirubin Urine: NEGATIVE
Hgb urine dipstick: NEGATIVE
KETONES UR: NEGATIVE
LEUKOCYTES UA: NEGATIVE
Nitrite: NEGATIVE
PH: 5.5 (ref 5.0–8.0)
Total Protein, Urine: NEGATIVE
URINE GLUCOSE: NEGATIVE
Urobilinogen, UA: 0.2 (ref 0.0–1.0)

## 2014-01-22 LAB — ALKALINE PHOSPHATASE: ALK PHOS: 113 U/L (ref 39–117)

## 2014-01-22 LAB — HEMOGLOBIN A1C: HEMOGLOBIN A1C: 6 % (ref 4.6–6.5)

## 2014-01-23 ENCOUNTER — Encounter: Payer: Self-pay | Admitting: Family

## 2014-01-23 ENCOUNTER — Telehealth: Payer: Self-pay | Admitting: Family

## 2014-01-23 DIAGNOSIS — R739 Hyperglycemia, unspecified: Secondary | ICD-10-CM | POA: Insufficient documentation

## 2014-01-23 NOTE — Telephone Encounter (Signed)
See my chart message

## 2014-01-26 ENCOUNTER — Other Ambulatory Visit: Payer: 59

## 2014-02-08 ENCOUNTER — Ambulatory Visit (INDEPENDENT_AMBULATORY_CARE_PROVIDER_SITE_OTHER)
Admission: RE | Admit: 2014-02-08 | Discharge: 2014-02-08 | Disposition: A | Discharge status: Home / Self Care | Payer: 59 | Source: Ambulatory Visit | Attending: Family | Admitting: Family

## 2014-02-08 DIAGNOSIS — Z0000 Encounter for general adult medical examination without abnormal findings: Secondary | ICD-10-CM

## 2014-04-19 ENCOUNTER — Other Ambulatory Visit: Payer: Self-pay | Admitting: Family

## 2014-07-27 ENCOUNTER — Other Ambulatory Visit: Payer: Self-pay | Admitting: Family

## 2014-07-28 NOTE — Telephone Encounter (Signed)
Rx sent to pharmacy. LDM 

## 2014-08-03 ENCOUNTER — Ambulatory Visit: Payer: 59 | Admitting: Family

## 2014-08-11 ENCOUNTER — Ambulatory Visit: Payer: 59 | Admitting: Family

## 2014-10-04 ENCOUNTER — Encounter: Payer: Self-pay | Admitting: Family

## 2014-10-18 ENCOUNTER — Encounter: Payer: Self-pay | Admitting: Family

## 2014-10-18 ENCOUNTER — Ambulatory Visit (INDEPENDENT_AMBULATORY_CARE_PROVIDER_SITE_OTHER): Payer: 59 | Admitting: Family

## 2014-10-18 VITALS — BP 124/82 | HR 64 | Temp 98.2°F | Resp 16 | Ht 64.5 in | Wt 162.0 lb

## 2014-10-18 DIAGNOSIS — R739 Hyperglycemia, unspecified: Secondary | ICD-10-CM

## 2014-10-18 DIAGNOSIS — F329 Major depressive disorder, single episode, unspecified: Secondary | ICD-10-CM

## 2014-10-18 DIAGNOSIS — Z0000 Encounter for general adult medical examination without abnormal findings: Secondary | ICD-10-CM

## 2014-10-18 DIAGNOSIS — F419 Anxiety disorder, unspecified: Secondary | ICD-10-CM

## 2014-10-18 DIAGNOSIS — F418 Other specified anxiety disorders: Secondary | ICD-10-CM

## 2014-10-18 DIAGNOSIS — F32A Depression, unspecified: Secondary | ICD-10-CM

## 2014-10-18 DIAGNOSIS — G43809 Other migraine, not intractable, without status migrainosus: Secondary | ICD-10-CM

## 2014-10-18 DIAGNOSIS — E785 Hyperlipidemia, unspecified: Secondary | ICD-10-CM

## 2014-10-18 MED ORDER — TRAZODONE HCL 100 MG PO TABS: ORAL_TABLET | ORAL | Status: DC

## 2014-10-18 MED ORDER — BUPROPION HCL ER (XL) 150 MG PO TB24: ORAL_TABLET | ORAL | Status: DC

## 2014-10-18 MED ORDER — ATORVASTATIN CALCIUM 20 MG PO TABS: ORAL_TABLET | ORAL | Status: DC

## 2014-10-18 MED ORDER — ESCITALOPRAM OXALATE 20 MG PO TABS: ORAL_TABLET | ORAL | Status: DC

## 2014-10-18 NOTE — Progress Notes (Signed)
Pre visit review using our clinic review tool, if applicable. No additional management support is needed unless otherwise documented below in the visit note. 

## 2014-10-18 NOTE — Patient Instructions (Addendum)
Please complete lab work prior to leaving. Follow up in 6 months for complete physical.

## 2014-10-18 NOTE — Progress Notes (Signed)
Subjective:    Patient ID: Rebecca Bentley, female    DOB: 07-Aug-1961, 53 y.o.   MRN: [id number]  HPI  Rebecca Bentley is a 53 yr old female who presents today for follow up of multiple medical problems.  1) Anxiety/depression- she is maintained on wellbutrin, lexapro and prn xanax. She rarely needs xanax. Last dose was Saturday night to help her sleep. Her father recently passed away. She has had a lot of stress but is "managing well."    2) Hyperlipidemia- maintained on statin. Denies myalgia.  Lab Results  Component Value Date   CHOL 150 01/05/2014   HDL 49 01/05/2014   LDLCALC 71 01/05/2014   TRIG 152* 01/05/2014   CHOLHDL 3.1 01/05/2014   3) Hyperglycemia-  Denies polyuria, denies polydipsia.   Lab Results  Component Value Date   HGBA1C 6.0 01/22/2014   Migraines- uses 1 x a month, lmp 8/14  Review of Systems See HPI  Past Medical History  Diagnosis Date  . Migraine   . Hyperlipidemia   . Anxiety   . Anisocoria   . RAD (reactive airway disease)   . Allergy   . Clostridium difficile colitis     History   Social History  . Marital Status: Married    Spouse Name: Gwyndolyn Saxon    Number of Children: 1  . Years of Education: N/A   Occupational History  . 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 Smoking status: Never Smoker   . Smokeless tobacco: Never Used  . Alcohol Use: Yes     Comment: occasional  . Drug Use: No  . Sexual Activity:    Partners: Male    Birth Control/ Protection: Pill   Other Topics Concern  . Not on file   Social History Narrative   Married x 25 years, adult daughter lives at home   Has Jorge Mandril   She is an Therapist, sports at [employer]          Past Surgical History  Procedure Laterality Date  . Refractive surgery  2007    Dr. Gershon Crane  . Colonoscopy  08/2012  . Wisdom tooth extraction  07/31/13    Family History  Problem Relation Age of Onset  . Hyperlipidemia Father   . Heart  disease Father     stenting- age 1  . Migraines Father   . Hypertension Father   . Cancer Father     pancreas  . Diabetes Father   . Mental illness Brother     anxiety/depression  . Migraines Daughter   . Allergies Daughter   . Mental illness Mother     anxiety  . Hyperlipidemia Mother   . Breast cancer Mother   . Stroke Maternal Grandmother   . Hypertension Maternal Grandmother   . Hyperlipidemia Maternal Grandfather   . Heart disease Maternal Grandfather   . Uterine cancer Paternal Grandmother   . Migraines Paternal Grandmother   . Heart disease Paternal Grandfather   . Cancer Paternal Grandfather     Allergies  Allergen Reactions  . Penicillins Anaphylaxis  . Clindamycin/Lincomycin Diarrhea    Current Outpatient Prescriptions on File Prior to Visit  Medication Sig Dispense Refill  . ALPRAZolam (XANAX) 0.5 MG tablet Take 1 tablet (0.5 mg total) by mouth 3 (three) times daily as needed. 90 tablet 0  . atorvastatin (LIPITOR) 20 MG tablet TAKE 1 TABLET BY MOUTH DAILY. 90 tablet 1  .  buPROPion (WELLBUTRIN XL) 150 MG 24 hr tablet TAKE 1 TABLET BY MOUTH DAILY. 90 tablet 1  . escitalopram (LEXAPRO) 20 MG tablet TAKE 1 TABLET BY MOUTH DAILY. 90 tablet 1  . Multiple Vitamin (MULTIVITAMIN) tablet Take 1 tablet by mouth daily.    Marland Kitchen RA KRILL OIL 500 MG CAPS Take 1 capsule by mouth daily.    Marland Kitchen saccharomyces boulardii (FLORASTOR) 250 MG capsule Take 250 mg by mouth 2 (two) times daily.    . SUMAtriptan (IMITREX) 100 MG tablet Take 1 tablet (100 mg total) by mouth every 2 (two) hours as needed. 10 tablet 6  . traZODone (DESYREL) 100 MG tablet TAKE 1 TABLET (100 MG TOTAL) BY MOUTH AT BEDTIME. 30 tablet 0   No current facility-administered medications on file prior to visit.    BP 124/82 mmHg  Pulse 64  Temp(Src) 98.2 F (36.8 C) (Oral)  Resp 16  Ht 5' 4.5" (1.638 m)  Wt 162 lb (73.483 kg)  BMI 27.39 kg/m2  SpO2 99%       Objective:   Physical Exam  Constitutional: She  is oriented to person, place, and time. She appears well-developed and well-nourished. No distress.  HENT:  Head: Normocephalic and atraumatic.  Cardiovascular: Normal rate and regular rhythm.   No murmur heard. Pulmonary/Chest: Effort normal and breath sounds normal. No respiratory distress. She has no wheezes. She has no rales. She exhibits no tenderness.  Musculoskeletal: She exhibits no edema.  Neurological: She is alert and oriented to person, place, and time.  Skin: Skin is warm and dry.  Psychiatric: She has a normal mood and affect. Her behavior is normal. Judgment and thought content normal.          Assessment & Plan:

## 2014-10-19 LAB — HEMOGLOBIN A1C: Hgb A1c MFr Bld: 5.8 % (ref 4.6–6.5)

## 2014-10-19 NOTE — Assessment & Plan Note (Signed)
Obtain follow up A1C.   

## 2014-10-19 NOTE — Assessment & Plan Note (Addendum)
Stable on statin.  Elevated trigs likely related to hyperglycemia.  Continue krill oil.

## 2014-10-19 NOTE — Assessment & Plan Note (Signed)
Stable on current meds.  Continue same. 

## 2014-10-19 NOTE — Assessment & Plan Note (Signed)
Stable with occasional use of imitrex.

## 2014-11-12 ENCOUNTER — Encounter: Payer: Self-pay | Admitting: Family

## 2014-11-24 ENCOUNTER — Telehealth: Payer: Self-pay | Admitting: Family

## 2014-11-24 DIAGNOSIS — G4700 Insomnia, unspecified: Secondary | ICD-10-CM

## 2014-11-24 DIAGNOSIS — F419 Anxiety disorder, unspecified: Secondary | ICD-10-CM

## 2014-11-24 MED ORDER — ALPRAZOLAM 0.5 MG PO TABS
0.5000 mg | ORAL_TABLET | Freq: Three times a day (TID) | ORAL | Status: DC | PRN
Start: 2014-11-24 — End: 2016-02-13

## 2014-11-24 NOTE — Telephone Encounter (Signed)
Rx given to pt. 

## 2014-11-24 NOTE — Telephone Encounter (Signed)
Pt requesting refill on xanax. 

## 2014-12-13 ENCOUNTER — Other Ambulatory Visit: Payer: Self-pay

## 2014-12-13 DIAGNOSIS — Z1231 Encounter for screening mammogram for malignant neoplasm of breast: Secondary | ICD-10-CM

## 2014-12-15 ENCOUNTER — Encounter: Payer: Self-pay | Admitting: Family Medicine

## 2014-12-15 ENCOUNTER — Encounter: Payer: 59 | Admitting: Family Medicine

## 2014-12-15 ENCOUNTER — Ambulatory Visit (INDEPENDENT_AMBULATORY_CARE_PROVIDER_SITE_OTHER): Payer: 59 | Admitting: Family Medicine

## 2014-12-15 VITALS — BP 142/92 | HR 74

## 2014-12-15 DIAGNOSIS — R1013 Epigastric pain: Secondary | ICD-10-CM | POA: Insufficient documentation

## 2014-12-15 LAB — EKG 12-LEAD

## 2014-12-15 MED ORDER — GI COCKTAIL ~~LOC~~
30.0000 mL | Freq: Once | ORAL | Status: AC
  Administered 2014-12-15: 30 mL via ORAL

## 2014-12-15 NOTE — Progress Notes (Signed)
Pre visit review using our clinic review tool, if applicable. No additional management support is needed unless otherwise documented below in the visit note. 

## 2014-12-15 NOTE — Assessment & Plan Note (Signed)
New.  Pt w/ epigastric pain radiating through to back.  + nausea, dizziness.  EKG WNL.  Pain resolved w/ lying down and GI cocktail.  Suspect this was GERD due to caffeine on empty stomach.  Pt not interested in having labs or ER w/u at this time.  Reviewed supportive care and red flags that should prompt return.  Pt expressed understanding and is in agreement w/ plan.

## 2014-12-15 NOTE — Progress Notes (Signed)
   Subjective:    Patient ID: Rebecca Bentley, female    DOB: 20-Jan-1961, 54 y.o.   MRN: [id number]  HPI Epigastric pain- new.  Pt developed severe band like epigastric pain this AM after drinking caffeine on empty stomach.  Pain was radiating from epigastrum through to back.  Pain caused pt to get dizzy and nauseated w/ some R facial numbness.  No CP, SOB, visual changes.  No radiation of pain to jaw/shoulder.   Review of Systems For ROS see HPI     Objective:   Physical Exam  Constitutional: She is oriented to person, place, and time. She appears well-developed and well-nourished.  Pale, lying on exam table  HENT:  Head: Normocephalic and atraumatic.  Cardiovascular: Normal rate, regular rhythm, normal heart sounds and intact distal pulses.   Pulmonary/Chest: Effort normal and breath sounds normal. No respiratory distress. She has no wheezes. She has no rales.  Abdominal: Soft. Bowel sounds are normal. She exhibits no distension. There is no tenderness. There is no rebound and no guarding.  Musculoskeletal: She exhibits no edema.  Neurological: She is alert and oriented to person, place, and time.  Skin: Skin is warm and dry. No erythema.  Psychiatric: She has a normal mood and affect. Her behavior is normal. Thought content normal.  Vitals reviewed.         Assessment & Plan:

## 2014-12-15 NOTE — Progress Notes (Signed)
Pt complained of epigastric pain that radiated to back, shortness of breath, dizziness and increase in pain when standing, and tingling sensation on right side of face.

## 2015-01-07 ENCOUNTER — Ambulatory Visit (INDEPENDENT_AMBULATORY_CARE_PROVIDER_SITE_OTHER): Payer: 59 | Admitting: Family

## 2015-01-07 VITALS — BP 138/82 | HR 66 | Resp 18

## 2015-01-07 DIAGNOSIS — M5412 Radiculopathy, cervical region: Secondary | ICD-10-CM

## 2015-01-07 DIAGNOSIS — M542 Cervicalgia: Secondary | ICD-10-CM | POA: Insufficient documentation

## 2015-01-07 MED ORDER — METHYLPREDNISOLONE 4 MG PO KIT: PACK | ORAL | Status: DC

## 2015-01-07 NOTE — Progress Notes (Signed)
Subjective:    Patient ID: Rebecca Bentley, female    DOB: Mar 20, 1961, 54 y.o.   MRN: [id number]  HPI  Ms. Rebecca Bentley is a 54 yr old female who presents today with chief complaint of left arm pain.  Pain has been present x 2 weeks and is described as aching. Pain is not relieved by motrin, tylenol or muscle relaxer. Some associated pain at the base of her left neck as well as tingling and weakness in the left arm. Did have massage therapy treatment last Friday without improvement.   Review of Systems See HPI  Past Medical History  Diagnosis Date  . Migraine   . Hyperlipidemia   . Anxiety   . Anisocoria   . RAD (reactive airway disease)   . Allergy   . Clostridium difficile colitis     History   Social History  . Marital Status: Married    Spouse Name: Gwyndolyn Saxon    Number of Children: 1  . Years of Education: N/A   Occupational History  . 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 Smoking status: Never Smoker   . Smokeless tobacco: Never Used  . Alcohol Use: Yes     Comment: occasional  . Drug Use: No  . Sexual Activity:    Partners: Male    Birth Control/ Protection: Pill   Other Topics Concern  . Not on file   Social History Narrative   Married x 25 years, adult daughter lives at home   Has Jorge Mandril   She is an Therapist, sports at [employer]          Past Surgical History  Procedure Laterality Date  . Refractive surgery  2007    Dr. Gershon Crane  . Colonoscopy  08/2012  . Wisdom tooth extraction  07/31/13    Family History  Problem Relation Age of Onset  . Hyperlipidemia Father   . Heart disease Father     stenting- age 77  . Migraines Father   . Hypertension Father   . Cancer Father     pancreas  . Diabetes Father   . Mental illness Brother     anxiety/depression  . Migraines Daughter   . Allergies Daughter   . Mental illness Mother     anxiety  . Hyperlipidemia Mother   . Breast cancer Mother   . Stroke  Maternal Grandmother   . Hypertension Maternal Grandmother   . Hyperlipidemia Maternal Grandfather   . Heart disease Maternal Grandfather   . Uterine cancer Paternal Grandmother   . Migraines Paternal Grandmother   . Heart disease Paternal Grandfather   . Cancer Paternal Grandfather     Allergies  Allergen Reactions  . Penicillins Anaphylaxis  . Clindamycin/Lincomycin Diarrhea    Current Outpatient Prescriptions on File Prior to Visit  Medication Sig Dispense Refill  . ALPRAZolam (XANAX) 0.5 MG tablet Take 1 tablet (0.5 mg total) by mouth 3 (three) times daily as needed. 90 tablet 0  . atorvastatin (LIPITOR) 20 MG tablet TAKE 1 TABLET BY MOUTH DAILY. 90 tablet 1  . buPROPion (WELLBUTRIN XL) 150 MG 24 hr tablet TAKE 1 TABLET BY MOUTH DAILY. 90 tablet 1  . escitalopram (LEXAPRO) 20 MG tablet TAKE 1 TABLET BY MOUTH DAILY. 90 tablet 1  . Multiple Vitamin (MULTIVITAMIN) tablet Take 1 tablet by mouth daily.    Marland Kitchen RA KRILL OIL 500 MG CAPS Take 1 capsule by mouth daily.    Marland Kitchen  saccharomyces boulardii (FLORASTOR) 250 MG capsule Take 250 mg by mouth 2 (two) times daily.    . SUMAtriptan (IMITREX) 100 MG tablet Take 1 tablet (100 mg total) by mouth every 2 (two) hours as needed. 10 tablet 6  . traZODone (DESYREL) 100 MG tablet TAKE 1 TABLET (100 MG TOTAL) BY MOUTH AT BEDTIME. 90 tablet 1   No current facility-administered medications on file prior to visit.    BP 138/82 mmHg  Pulse 66  Resp 18       Objective:   Physical Exam  Constitutional: She is oriented to person, place, and time. She appears well-developed and well-nourished. No distress.  Musculoskeletal:       Cervical back: She exhibits no tenderness.  Neurological: She is alert and oriented to person, place, and time.  Reflex Scores:      Bicep reflexes are 2+ on the right side and 2+ on the left side.      Brachioradialis reflexes are 2+ on the right side and 2+ on the left side. Bilateral UE strength is 5/5  Skin: Skin  is warm and dry.  Psychiatric: She has a normal mood and affect. Her behavior is normal. Judgment and thought content normal.          Assessment & Plan:

## 2015-01-07 NOTE — Patient Instructions (Signed)
Start medrol dose pak. Call if symptoms worsen or if not improved in 1 week.

## 2015-01-07 NOTE — Assessment & Plan Note (Signed)
New. Symptoms most consistent with cervical radiculopathy. Will give trial medrol dose pak. Consider MRI C spine if symptoms worsen or if symptoms do not improve.

## 2015-01-10 ENCOUNTER — Ambulatory Visit
Admission: RE | Admit: 2015-01-10 | Discharge: 2015-01-10 | Disposition: A | Discharge status: Home / Self Care | Payer: 59 | Source: Ambulatory Visit

## 2015-01-10 DIAGNOSIS — Z1231 Encounter for screening mammogram for malignant neoplasm of breast: Secondary | ICD-10-CM

## 2015-01-21 ENCOUNTER — Ambulatory Visit (INDEPENDENT_AMBULATORY_CARE_PROVIDER_SITE_OTHER): Payer: 59 | Admitting: Family

## 2015-01-21 ENCOUNTER — Encounter: Payer: Self-pay | Admitting: Family

## 2015-01-21 VITALS — BP 120/80 | HR 70 | Temp 98.3°F | Resp 18 | Ht 64.5 in

## 2015-01-21 DIAGNOSIS — M542 Cervicalgia: Secondary | ICD-10-CM

## 2015-01-21 DIAGNOSIS — G43809 Other migraine, not intractable, without status migrainosus: Secondary | ICD-10-CM

## 2015-01-21 MED ORDER — MELOXICAM 7.5 MG PO TABS: 7.5000 mg | ORAL_TABLET | Freq: Every day | ORAL | Status: DC

## 2015-01-21 MED ORDER — CYCLOBENZAPRINE HCL 5 MG PO TABS: 5.0000 mg | ORAL_TABLET | Freq: Three times a day (TID) | ORAL | Status: DC | PRN

## 2015-01-21 NOTE — Progress Notes (Signed)
Pre visit review using our clinic review tool, if applicable. No additional management support is needed unless otherwise documented below in the visit note. 

## 2015-01-21 NOTE — Progress Notes (Signed)
Subjective:    Patient ID: Rebecca Bentley, female    DOB: 1961/03/08, 54 y.o.   MRN: [id number]  HPI  Rebecca Bentley is a 54 yr old female who presents today to discuss neck pain and HA.  Had massage therapy 1 week ago, later that night the pain worsened. Had associated left sided cervical radiculopathy down the left arm.  She was treated with a medrol dose pak on 2/5 and note that this did help her symptoms initially.  Today she took 1/2 tab of hydrocodone this AM that she had left over from a dental procedure. This helped a little with the pain. Her headache has associated  + photophobia, mild phonophobia. Took imitrex yesterday which "dulled the pain." HA started yesterday. Does have some pain behind the eyes.  Denies radicular neck pain today.   Review of Systems See HPI  Past Medical History  Diagnosis Date  . Migraine   . Hyperlipidemia   . Anxiety   . Anisocoria   . RAD (reactive airway disease)   . Allergy   . Clostridium difficile colitis     History   Social History  . Marital Status: Married    Spouse Name: Gwyndolyn Saxon  . Number of Children: 1  . Years of Education: N/A   Occupational History  . 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 Smoking status: Never Smoker   . Smokeless tobacco: Never Used  . Alcohol Use: Yes     Comment: occasional  . Drug Use: No  . Sexual Activity:    Partners: Male    Birth Control/ Protection: Pill   Other Topics Concern  . Not on file   Social History Narrative   Married x 25 years, adult daughter lives at home   Has Jorge Mandril   She is an Therapist, sports at [employer]          Past Surgical History  Procedure Laterality Date  . Refractive surgery  2007    Dr. Gershon Crane  . Colonoscopy  08/2012  . Wisdom tooth extraction  07/31/13    Family History  Problem Relation Age of Onset  . Hyperlipidemia Father   . Heart disease Father     stenting- age 78  . Migraines Father   .  Hypertension Father   . Cancer Father     pancreas  . Diabetes Father   . Mental illness Brother     anxiety/depression  . Migraines Daughter   . Allergies Daughter   . Mental illness Mother     anxiety  . Hyperlipidemia Mother   . Breast cancer Mother   . Stroke Maternal Grandmother   . Hypertension Maternal Grandmother   . Hyperlipidemia Maternal Grandfather   . Heart disease Maternal Grandfather   . Uterine cancer Paternal Grandmother   . Migraines Paternal Grandmother   . Heart disease Paternal Grandfather   . Cancer Paternal Grandfather     Allergies  Allergen Reactions  . Penicillins Anaphylaxis  . Clindamycin/Lincomycin Diarrhea    Current Outpatient Prescriptions on File Prior to Visit  Medication Sig Dispense Refill  . ALPRAZolam (XANAX) 0.5 MG tablet Take 1 tablet (0.5 mg total) by mouth 3 (three) times daily as needed. 90 tablet 0  . atorvastatin (LIPITOR) 20 MG tablet TAKE 1 TABLET BY MOUTH DAILY. 90 tablet 1  . buPROPion (WELLBUTRIN XL) 150 MG 24 hr tablet TAKE 1 TABLET BY MOUTH DAILY.  90 tablet 1  . escitalopram (LEXAPRO) 20 MG tablet TAKE 1 TABLET BY MOUTH DAILY. 90 tablet 1  . methylPREDNISolone (MEDROL DOSEPAK) 4 MG tablet follow package directions 21 tablet 0  . Multiple Vitamin (MULTIVITAMIN) tablet Take 1 tablet by mouth daily.    Marland Kitchen RA KRILL OIL 500 MG CAPS Take 1 capsule by mouth daily.    Marland Kitchen saccharomyces boulardii (FLORASTOR) 250 MG capsule Take 250 mg by mouth 2 (two) times daily.    . SUMAtriptan (IMITREX) 100 MG tablet Take 1 tablet (100 mg total) by mouth every 2 (two) hours as needed. 10 tablet 6  . traZODone (DESYREL) 100 MG tablet TAKE 1 TABLET (100 MG TOTAL) BY MOUTH AT BEDTIME. 90 tablet 1   No current facility-administered medications on file prior to visit.    BP 120/80 mmHg  Pulse 70  Temp(Src) 98.3 F (36.8 C) (Oral)  Resp 18  Ht 5' 4.5" (1.638 m)  Wt   SpO2 98%       Objective:   Physical Exam  Constitutional: She appears  well-developed and well-nourished.  HENT:  Head: Normocephalic and atraumatic.  Eyes: Pupils are equal, round, and reactive to light.  Cardiovascular: Normal rate, regular rhythm and normal heart sounds.   No murmur heard. Pulmonary/Chest: Effort normal and breath sounds normal. No respiratory distress. She has no wheezes.  Musculoskeletal: She exhibits no edema.  Neurological: She is alert. She exhibits normal muscle tone.  Skin: Skin is warm and dry.  Psychiatric: She has a normal mood and affect. Her behavior is normal. Judgment and thought content normal.          Assessment & Plan:

## 2015-01-21 NOTE — Patient Instructions (Addendum)
Use imitrex as needed.  You may also use flexeril and meloxicam as needed. You will be contacted about your MRI.

## 2015-01-21 NOTE — Assessment & Plan Note (Signed)
I think this is being worsened by neck pain. Flexeril and meloxicam will hopefully also help with HA. May continue imitrex prn.

## 2015-01-21 NOTE — Assessment & Plan Note (Addendum)
Uncontrolled. Intermittent radicular symptoms, despite recent steroid rx.  Will rx with flexeril, meloxicam, refer for mri of the cervical spine.

## 2015-01-22 ENCOUNTER — Encounter: Payer: Self-pay | Admitting: Family

## 2015-01-22 ENCOUNTER — Ambulatory Visit (HOSPITAL_BASED_OUTPATIENT_CLINIC_OR_DEPARTMENT_OTHER)
Admission: RE | Admit: 2015-01-22 | Discharge: 2015-01-22 | Disposition: A | Discharge status: Home / Self Care | Payer: 59 | Source: Ambulatory Visit | Attending: Family | Admitting: Family

## 2015-01-22 DIAGNOSIS — M542 Cervicalgia: Secondary | ICD-10-CM | POA: Insufficient documentation

## 2015-02-17 ENCOUNTER — Encounter: Payer: Self-pay | Admitting: Physical Therapy

## 2015-02-17 ENCOUNTER — Ambulatory Visit: Payer: 59 | Attending: Neurosurgery | Admitting: Physical Therapy

## 2015-02-17 DIAGNOSIS — M436 Torticollis: Secondary | ICD-10-CM | POA: Insufficient documentation

## 2015-02-17 DIAGNOSIS — M542 Cervicalgia: Secondary | ICD-10-CM | POA: Insufficient documentation

## 2015-02-17 NOTE — Therapy (Signed)
[facility] [address]  [place], [place], [address] Phone: [phone]   Fax:  [phone]  Physical Therapy Evaluation  Patient Details  Name: Rebecca Bentley MRN: [id number] Date of Birth: September 23, 1961 Referring Provider:  Earnie Larsson, MD  Encounter Date: 02/17/2015      PT End of Session - 02/17/15 1036    Visit Number 1   Number of Visits 12   Date for PT Re-Evaluation 03/31/15   PT Start Time 1018   PT Stop Time 1123   PT Time Calculation (min) 65 min      Past Medical History  Diagnosis Date  . Migraine   . Hyperlipidemia   . Anxiety   . Anisocoria   . RAD (reactive airway disease)   . Allergy   . Clostridium difficile colitis     Past Surgical History  Procedure Laterality Date  . Refractive surgery  2007    Dr. Gershon Crane  . Colonoscopy  08/2012  . Wisdom tooth extraction  07/31/13    There were no vitals filed for this visit.  Visit Diagnosis:  Neck pain - Plan: PT plan of care cert/re-cert  Neck stiffness - Plan: PT plan of care cert/re-cert      Subjective Assessment - 02/17/15 1028    Symptoms pt with chronic neck pain on/off for at least 10 years.  Has been participating in massage therapy 2x/month for past several years but states symptoms seem to be worsening lately.   Patient Stated Goals reduce pain and avoid surgery   Currently in Pain? Yes   Pain Score 5   AVG pain is 4-5/10 and worst pain up to at least a 10/10   Pain Location Neck   Pain Orientation Right   Pain Radiating Towards pain extends down L UE to hand/fingers.  Notes intermittent N/T to same distribution to include digits #2-4.  Notes intermittent HA which she believes is related to neck but has not had one in past 6 weeks.   Aggravating Factors  housework - sweeping, mopping, vacuuming; driving; prolonged standing/walking on concrete floors; prolonged computer work   Pain Relieving Factors meds; heat; massage, stretch  (R side bending stretch)   Multiple Pain Sites No            [facility] PT Assessment - 02/17/15 0001    Assessment   Medical Diagnosis spondylosis c-spine   Onset Date 12/03/04   Balance Screen   Has the patient fallen in the past 6 months No   Has the patient had a decrease in activity level because of a fear of falling?  No   Is the patient reluctant to leave their home because of a fear of falling?  No   Prior Function   Vocation Full time employment   Vocation Requirements some lifting, time split between sit/stand   Leisure enjoys walking for exercise 3x30' / week, enjoys reading   Observation/Other Assessments   Focus on Therapeutic Outcomes (FOTO)  58% limitation   Posture/Postural Control   Posture Comments mild forward head and rounded shoulders   ROM / Strength   AROM / PROM / Strength AROM;Strength   AROM   AROM Assessment Site Cervical   Cervical Flexion 30  pulling into lower t-spine   Cervical Extension 45  pain in lower c-spine and in lower t-spine   Cervical - Right Side Bend 27   Cervical - Left Side Bend 20  L upper scap pain  Cervical - Right Rotation 60  r neck pain   Cervical - Left Rotation 64  l neck pain   Strength   Overall Strength Comments B Shoulder and Elbow 5/5 grossly other than B ABD 4+/5 due to upper scapular and neck pain.  B Lower Trap 4-/5, mid trap and rhomboids 4/5.  Mild pain with B scapular MMT.   Special Tests    Special Tests Cervical   Cervical Tests other;other2   other    Findings Positive   Comment L Quadrant   other    Findings Positive   Side Left   Comment ULTT (median)         TODAY'S TREATMENT HEP instruct / perform Corner pec stretch and diagonal prayer stretch  Mechanical Traction - c-spine, 20 degree pull, 10#/5#, 60"/20", 15'                  PT Education - 02/17/15 1152    Education provided Yes   Education Details HEP   Person(s) Educated Patient   Methods  Explanation;Demonstration;Handout   Comprehension Returned demonstration;Verbalized understanding          PT Short Term Goals - 02/17/15 1154    PT SHORT TERM GOAL #1   Title pt independent with initial HEP by 03/03/15   Status New           PT Long Term Goals - 02/17/15 1155    PT LONG TERM GOAL #1   Title pt independent with advanced HEP as necessary by 03/31/15   Status New   PT LONG TERM GOAL #2   Title c-spine AROM painfree and WFL by 03/31/15   Status New   PT LONG TERM GOAL #3   Title pt able to drive and perform work duties (computer work and prolonged stand/walking) without restriction by neck pain by 03/31/15   Status New   PT LONG TERM GOAL #4   Title pt denies UE N/T by 03/31/15   Status New               Plan - 02/17/15 1152    Clinical Impression Statement pt with chronic neck pain in part posture related (excessive mid c-spine extension).  Tightness noted in B lats and pecs.  LOM in c-spine.  Pain noted shoulder ABD and scap MMT.   Pt will benefit from skilled therapeutic intervention in order to improve on the following deficits Improper body mechanics;Impaired flexibility;Decreased range of motion;Pain;Postural dysfunction   Rehab Potential Good   PT Frequency 2x / week   PT Duration 6 weeks   PT Treatment/Interventions Manual techniques;Dry needling;Traction;Therapeutic exercise;Therapeutic activities;Patient/family education;Cryotherapy;Electrical Stimulation;Moist Heat   PT Next Visit Plan increase traction 12# if able, consider dry needling and/or taping.  Scapular exercises and foam roll use.   Consulted and Agree with Plan of Care Patient         Problem List Patient Active Problem List   Diagnosis Date Noted  . Neck pain 01/07/2015  . Abdominal pain, epigastric 12/15/2014  . Hyperglycemia 01/23/2014  . Routine general medical examination at a health care facility 01/05/2014  . Personal history of colonic polyps-adenoma 08/29/2012  .  Hyperlipidemia 07/31/2012  . Anxiety and depression 07/31/2012  . Insomnia 07/31/2012  . Migraine   . Anisocoria     Ame Heagle PT, OCS 02/17/2015, 11:58 AM  [facility] [address]  [place], [place], [address] Phone: [phone]   Fax:  [phone]

## 2015-02-21 ENCOUNTER — Ambulatory Visit: Payer: 59 | Admitting: Rehabilitation

## 2015-02-21 DIAGNOSIS — M436 Torticollis: Secondary | ICD-10-CM

## 2015-02-21 DIAGNOSIS — M542 Cervicalgia: Secondary | ICD-10-CM | POA: Diagnosis not present

## 2015-02-21 NOTE — Therapy (Signed)
[facility] [address]  [place], [place], [address] Phone: [phone]   Fax:  [phone]  Physical Therapy Treatment  Patient Details  Name: Rebecca Bentley MRN: [id number] Date of Birth: 1960-12-11 Referring Provider:  Earnie Larsson, MD  Encounter Date: 02/21/2015      PT End of Session - 02/21/15 1446    Visit Number 2   Number of Visits 12   Date for PT Re-Evaluation 03/31/15   PT Start Time 2831   PT Stop Time 1551   PT Time Calculation (min) 62 min   Activity Tolerance Patient tolerated treatment well   Behavior During Therapy [facility] for tasks assessed/performed      Past Medical History  Diagnosis Date  . Migraine   . Hyperlipidemia   . Anxiety   . Anisocoria   . RAD (reactive airway disease)   . Allergy   . Clostridium difficile colitis     Past Surgical History  Procedure Laterality Date  . Refractive surgery  2007    Dr. Gershon Crane  . Colonoscopy  08/2012  . Wisdom tooth extraction  07/31/13    There were no vitals filed for this visit.  Visit Diagnosis:  Neck pain  Neck stiffness      Subjective Assessment - 02/21/15 1452    Symptoms Traction felt good, but had a little pain the next day. Also had some pain over the weekend that was relieved with pain meds.     Currently in Pain? Yes   Pain Score 5    Pain Location Neck   Pain Orientation Left     TODAY'S TREATMENT TherEx- UBE level 1.0 90"/90" Corner pec stretch 3x30" Corner scap retraction x10  Hooklying 1/2 foam roll: pec stretch x60", pullover 3# x10, bilateral horizontal abduction red TB x10, bilateral ER red TB x10 (difficulty noted with Lt side)  Manual- Ulnar and median nerve glides to Lt UE.  STM to bilateral UT, upper rhomboids, cervical paraspinals (Lt > Rt).     Mechanical Traction - c-spine, 20 degree pull, 10#/5#, 60"/20", x 15'            PT Short Term Goals - 02/17/15 1154    PT SHORT TERM GOAL #1   Title pt independent with initial HEP by 03/03/15   Status New           PT Long Term Goals - 02/17/15 1155    PT LONG TERM GOAL #1   Title pt independent with advanced HEP as necessary by 03/31/15   Status New   PT LONG TERM GOAL #2   Title c-spine AROM painfree and WFL by 03/31/15   Status New   PT LONG TERM GOAL #3   Title pt able to drive and perform work duties (computer work and prolonged stand/walking) without restriction by neck pain by 03/31/15   Status New   PT LONG TERM GOAL #4   Title pt denies UE N/T by 03/31/15   Status New               Plan - 02/21/15 1535    Clinical Impression Statement Pt tolerated treatment well with discomfort noted with nerve glides and Lt ER on the foam roll. Did not increase traction today due to slight increase in pain after last time.    PT Next Visit Plan Increase traction if able. Progress as tolerated.    Consulted and Agree with Plan of Care Patient  Problem List Patient Active Problem List   Diagnosis Date Noted  . Neck pain 01/07/2015  . Abdominal pain, epigastric 12/15/2014  . Hyperglycemia 01/23/2014  . Routine general medical examination at a health care facility 01/05/2014  . Personal history of colonic polyps-adenoma 08/29/2012  . Hyperlipidemia 07/31/2012  . Anxiety and depression 07/31/2012  . Insomnia 07/31/2012  . Migraine   . Anisocoria     Barbette Hair, PTA 02/21/2015, 3:40 PM  [facility] [address]  [place], [place], [address] Phone: [phone]   Fax:  [phone]

## 2015-02-23 ENCOUNTER — Ambulatory Visit: Payer: 59 | Admitting: Physical Therapy

## 2015-02-23 DIAGNOSIS — M542 Cervicalgia: Secondary | ICD-10-CM

## 2015-02-23 DIAGNOSIS — M436 Torticollis: Secondary | ICD-10-CM

## 2015-02-23 NOTE — Therapy (Signed)
[facility] [address]  [place], [place], [address] Phone: [phone]   Fax:  [phone]  Physical Therapy Treatment  Patient Details  Name: Rebecca Bentley MRN: [id number] Date of Birth: September 05, 1961 Referring Provider:  Earnie Larsson, MD  Encounter Date: 02/23/2015      PT End of Session - 02/23/15 1514    Visit Number 3   Number of Visits 12   Date for PT Re-Evaluation 03/31/15   PT Start Time 1410   PT Stop Time 1513   PT Time Calculation (min) 63 min      Past Medical History  Diagnosis Date  . Migraine   . Hyperlipidemia   . Anxiety   . Anisocoria   . RAD (reactive airway disease)   . Allergy   . Clostridium difficile colitis     Past Surgical History  Procedure Laterality Date  . Refractive surgery  2007    Dr. Gershon Crane  . Colonoscopy  08/2012  . Wisdom tooth extraction  07/31/13    There were no vitals filed for this visit.  Visit Diagnosis:  Neck pain  Neck stiffness      Subjective Assessment - 02/23/15 1412    Symptoms felt good this AM prior to coming to work, currently 4/10.  Exp'd some n/t into L UE over the weekend while out running errands.  States sleeps well without limit by neck pain.   Currently in Pain? Yes   Pain Score 4    Pain Location Neck   Pain Orientation Left            [facility] PT Assessment - 02/23/15 0001    AROM   Cervical - Right Side Bend 27  L neck pain   Cervical - Left Side Bend 20  L neck pain   Cervical - Right Rotation 60   Cervical - Left Rotation 65       TODAY'S TREATMENT: Manual - prone grade 4 pa mobes mid t-spine, grade 3 pa mobes to upper t-spine and B UPA grade 3 throughout c-spine.  B 1st rib grade 3 caudal glides. Supine STM and stretch B scalenes and to lesser extent to B UT.  Grade 4 c-spine retraction mobes 2x20", c-spine B rotation mobes grade 3.   TherEx - seated scalene self stretch with towel edge  Mechanical Traction -  c-spine, 20degree pull, 60"/20", 12#/6#, 15'                  PT Education - 02/23/15 1552    Education provided Yes   Education Details scalene stretch to [employer]) Educated Patient   Methods Explanation;Demonstration   Comprehension Verbalized understanding;Returned demonstration          PT Short Term Goals - 02/23/15 1554    PT SHORT TERM GOAL #1   Title pt independent with initial HEP by 03/03/15   Status Achieved           PT Long Term Goals - 02/23/15 1554    PT LONG TERM GOAL #1   Title pt independent with advanced HEP as necessary by 03/31/15   Status On-going   PT LONG TERM GOAL #2   Title c-spine AROM painfree and WFL by 03/31/15   Status On-going   PT LONG TERM GOAL #3   Title pt able to drive and perform work duties (computer work and prolonged stand/walking) without restriction by neck pain by 03/31/15  Status On-going   PT LONG TERM GOAL #4   Title pt denies UE N/T by 03/31/15   Status On-going               Plan - 02/23/15 1552    Clinical Impression Statement pt out of town next week so will not see for 10 days.  Very tight B scalenes (L>R) along with upper t-spine hypomobility.  pt tolerates manual very well   PT Next Visit Plan continue POC progressing as able        Problem List Patient Active Problem List   Diagnosis Date Noted  . Neck pain 01/07/2015  . Abdominal pain, epigastric 12/15/2014  . Hyperglycemia 01/23/2014  . Routine general medical examination at a health care facility 01/05/2014  . Personal history of colonic polyps-adenoma 08/29/2012  . Hyperlipidemia 07/31/2012  . Anxiety and depression 07/31/2012  . Insomnia 07/31/2012  . Migraine   . Anisocoria     Elana Jian PT, OCS 02/23/2015, 3:55 PM  [facility] [address]  [place], [place], [address] Phone: [phone]   Fax:  [phone]

## 2015-03-07 ENCOUNTER — Ambulatory Visit: Payer: 59 | Attending: Neurosurgery | Admitting: Physical Therapy

## 2015-03-07 DIAGNOSIS — M436 Torticollis: Secondary | ICD-10-CM | POA: Insufficient documentation

## 2015-03-07 DIAGNOSIS — M542 Cervicalgia: Secondary | ICD-10-CM | POA: Diagnosis not present

## 2015-03-07 NOTE — Therapy (Signed)
[facility] [address]  [place], [place], [address] Phone: [phone]   Fax:  [phone]  Physical Therapy Treatment  Patient Details  Name: Rebecca Bentley MRN: [id number] Date of Birth: [birth year] Referring Provider:  Earnie Larsson, MD  Encounter Date: 03/07/2015      PT End of Session - 03/07/15 1325    Visit Number 4   Number of Visits 12   Date for PT Re-Evaluation 03/31/15   PT Start Time 8588   PT Stop Time 1415   PT Time Calculation (min) 56 min      Past Medical History  Diagnosis Date  . Migraine   . Hyperlipidemia   . Anxiety   . Anisocoria   . RAD (reactive airway disease)   . Allergy   . Clostridium difficile colitis     Past Surgical History  Procedure Laterality Date  . Refractive surgery  2007    Dr. Gershon Crane  . Colonoscopy  08/2012  . Wisdom tooth extraction  07/31/13    There were no vitals filed for this visit.  Visit Diagnosis:  Neck pain  Neck stiffness      Subjective Assessment - 03/07/15 1322    Subjective [place] at beach [place] 12 [place]. States performed HEP some while away.  States noted L UE pain while driving to/from beach.  Today cc = "feels stiff" along with mild HA.   Currently in Pain? Yes   Pain Score --  3-4/10   Pain Location --  headache             TODAY'S TREATMENT: Manual - prone grade 4 pa mobes mid t-spine, grade 3 pa mobes to upper t-spine and B UPA grade 3 throughout c-spine. L 1st rib grade 3 caudal glides. Supine STM and stretch L scalenes. Grade 4 c-spine retraction mobes 2x20", c-spine B rotation mobes grade 3.   TherEx - UBE LVL 2.5 90"/90" POE c-spine AROM B rotation 10x Hooklying on Foam Roll: pec stretch/t-spine extension on Foam Roll, B Horiz ABD Blue TB 12x, B ER Blue TB 15x, Pullover 5# 10x Standing Low Row Black TB 15x  Mechanical Traction - c-spine, 20degree pull, 60"/20", 14#/7#, 15'                  PT Education - 03/07/15 1405    Education provided Yes   Education Details HEP update with low row   Person(s) Educated Patient   Methods Explanation;Demonstration;Handout   Comprehension Returned demonstration;Verbalized understanding          PT Short Term Goals - 02/23/15 1554    PT SHORT TERM GOAL #1   Title pt independent with initial HEP by 03/03/15   Status Achieved           PT Long Term Goals - 02/23/15 1554    PT LONG TERM GOAL #1   Title pt independent with advanced HEP as necessary by 03/31/15   Status On-going   PT LONG TERM GOAL #2   Title c-spine AROM painfree and WFL by 03/31/15   Status On-going   PT LONG TERM GOAL #3   Title pt able to drive and perform work duties (computer work and prolonged stand/walking) without restriction by neck pain by 03/31/15   Status On-going   PT LONG TERM GOAL #4   Title pt denies UE N/T by 03/31/15   Status On-going  Plan - 03/08/15 1522    Clinical Impression Statement pt was out of town all of last week and she returns to PT today. Overall no setback and seems to be progressing fairly well.  Hypomobile to upper t-spine, tightness to B scalenes, and intermittent cervicogenic headaches.    PT Next Visit Plan continue POC progressing as able, assess ROM        Problem List Patient Active Problem List   Diagnosis Date Noted  . Neck pain 01/07/2015  . Abdominal pain, epigastric 12/15/2014  . Hyperglycemia 01/23/2014  . Routine general medical examination at a health care facility 01/05/2014  . Personal history of colonic polyps-adenoma 08/29/2012  . Hyperlipidemia 07/31/2012  . Anxiety and depression 07/31/2012  . Insomnia 07/31/2012  . Migraine   . Anisocoria     Octavian Godek PT, OCS 03/08/2015, 3:26 PM  [facility] [address]  [place], [place], [address] Phone: [phone]   Fax:  [phone]

## 2015-03-10 ENCOUNTER — Ambulatory Visit: Payer: 59 | Admitting: Rehabilitation

## 2015-03-10 DIAGNOSIS — M542 Cervicalgia: Secondary | ICD-10-CM

## 2015-03-10 DIAGNOSIS — M436 Torticollis: Secondary | ICD-10-CM

## 2015-03-10 NOTE — Therapy (Signed)
[facility] [address]  [place], [place], [address] Phone: [phone]   Fax:  [phone]  Physical Therapy Treatment  Patient Details  Name: Rebecca Bentley MRN: [id number] Date of Birth: 10-30-1961 Referring Provider:  Earnie Larsson, MD  Encounter Date: 03/10/2015      PT End of Session - 03/10/15 1317    Visit Number 5   Number of Visits 12   Date for PT Re-Evaluation 03/31/15   PT Start Time 7654   PT Stop Time 1408   PT Time Calculation (min) 51 min   Activity Tolerance Patient tolerated treatment well   Behavior During Therapy [facility] for tasks assessed/performed      Past Medical History  Diagnosis Date  . Migraine   . Hyperlipidemia   . Anxiety   . Anisocoria   . RAD (reactive airway disease)   . Allergy   . Clostridium difficile colitis     Past Surgical History  Procedure Laterality Date  . Refractive surgery  2007    Dr. Gershon Crane  . Colonoscopy  08/2012  . Wisdom tooth extraction  07/31/13    There were no vitals filed for this visit.  Visit Diagnosis:  Neck pain  Neck stiffness      Subjective Assessment - 03/10/15 1320    Subjective Still has a headache, same one since Saturday. Felt ok after last time.    Currently in Pain? Yes   Pain Score --  2-3/10   Pain Location --  headache        TODAY'S TREATMENT: Manual -Supine STM and stretch L scalenes then STM to bil cervical paraspinals. SOR (pt reported relief with this)    TherEx - UBE LVL 2.5 90"/90" Hooklying on Foam Roll: pec stretch/t-spine extension on Foam Roll, Pullover 5# 10x3", B Horiz ABD Blue TB 15x, B ER Blue TB 15x, POE c-spine AROM B rotation 10x, diagonals 10x Low row 20# 15x  Mechanical Traction - c-spine, 20degree pull, 60"/20", 14#/7#, 15'         PT Short Term Goals - 02/23/15 1554    PT SHORT TERM GOAL #1   Title pt independent with initial HEP by 03/03/15   Status Achieved           PT Long  Term Goals - 02/23/15 1554    PT LONG TERM GOAL #1   Title pt independent with advanced HEP as necessary by 03/31/15   Status On-going   PT LONG TERM GOAL #2   Title c-spine AROM painfree and WFL by 03/31/15   Status On-going   PT LONG TERM GOAL #3   Title pt able to drive and perform work duties (computer work and prolonged stand/walking) without restriction by neck pain by 03/31/15   Status On-going   PT LONG TERM GOAL #4   Title pt denies UE N/T by 03/31/15   Status On-going               Plan - 03/10/15 1354    Clinical Impression Statement Continued cervicogenic headaches but notes relief with traction and SOR.    PT Next Visit Plan Check ROM next visit. Continue   Consulted and Agree with Plan of Care Patient        Problem List Patient Active Problem List   Diagnosis Date Noted  . Neck pain 01/07/2015  . Abdominal pain, epigastric 12/15/2014  . Hyperglycemia 01/23/2014  . Routine general medical examination at a  health care facility 01/05/2014  . Personal history of colonic polyps-adenoma 08/29/2012  . Hyperlipidemia 07/31/2012  . Anxiety and depression 07/31/2012  . Insomnia 07/31/2012  . Migraine   . Anisocoria     Barbette Hair, PTA 03/10/2015, 2:41 PM  [facility] [address]  [place], [place], [address] Phone: [phone]   Fax:  [phone]

## 2015-03-14 ENCOUNTER — Ambulatory Visit: Payer: 59 | Admitting: Rehabilitation

## 2015-03-14 DIAGNOSIS — M542 Cervicalgia: Secondary | ICD-10-CM

## 2015-03-14 DIAGNOSIS — M436 Torticollis: Secondary | ICD-10-CM

## 2015-03-14 NOTE — Therapy (Signed)
[facility] [address]  [place], [place], [address] Phone: [phone]   Fax:  [phone]  Physical Therapy Treatment  Patient Details  Name: Rebecca Bentley MRN: [id number] Date of Birth: 09-15-1961 Referring Provider:  Earnie Larsson, MD  Encounter Date: 03/14/2015      PT End of Session - 03/14/15 1401    Visit Number 6   Number of Visits 12   Date for PT Re-Evaluation 03/31/15   PT Start Time 1401   PT Stop Time 1449   PT Time Calculation (min) 48 min   Activity Tolerance Patient tolerated treatment well   Behavior During Therapy [facility] for tasks assessed/performed      Past Medical History  Diagnosis Date  . Migraine   . Hyperlipidemia   . Anxiety   . Anisocoria   . RAD (reactive airway disease)   . Allergy   . Clostridium difficile colitis     Past Surgical History  Procedure Laterality Date  . Refractive surgery  2007    Dr. Gershon Crane  . Colonoscopy  08/2012  . Wisdom tooth extraction  07/31/13    There were no vitals filed for this visit.  Visit Diagnosis:  Neck pain  Neck stiffness      Subjective Assessment - 03/14/15 1402    Subjective Reports doing a little better today, had 2 hours of massage therapy on Saturday and was very sore afterwards. Doing better today.    Currently in Pain? Yes   Pain Score 3    Pain Location --  primarily Lt neck pain, some on Rt            [facility] PT Assessment - 03/14/15 0001    ROM / Strength   AROM / PROM / Strength AROM   AROM   AROM Assessment Site Cervical   Cervical - Right Side Bend 28  Lt neck pain   Cervical - Left Side Bend 28  Lt neck pain   Cervical - Right Rotation 63   Cervical - Left Rotation 65       TODAY'S TREATMENT: Manual -Supine STM and stretch to bil scalenes (L>R)then STM to bil cervical paraspinals. SOR.  TherEx - UBE LVL 2.5 90"/90" Hooklying on Foam Roll: pec stretch/t-spine extension on Foam Roll, Pullover 5#  15x3", B Horiz ABD Blue TB 15x, angel arms Blue TB 10x POE c-spine AROM diagonals 10x, protraction/retraction x10 Low row 25# 15x  Mechanical Traction - c-spine, 20degree pull, 60"/20", 14#/7#, 15'           PT Short Term Goals - 02/23/15 1554    PT SHORT TERM GOAL #1   Title pt independent with initial HEP by 03/03/15   Status Achieved           PT Long Term Goals - 02/23/15 1554    PT LONG TERM GOAL #1   Title pt independent with advanced HEP as necessary by 03/31/15   Status On-going   PT LONG TERM GOAL #2   Title c-spine AROM painfree and WFL by 03/31/15   Status On-going   PT LONG TERM GOAL #3   Title pt able to drive and perform work duties (computer work and prolonged stand/walking) without restriction by neck pain by 03/31/15   Status On-going   PT LONG TERM GOAL #4   Title pt denies UE N/T by 03/31/15   Status On-going  Plan - 03/14/15 1434    Clinical Impression Statement No complaints during exercises, still tender to bil cervical paraspinals/scalenes. Slight improvements noted with ROM today.    PT Next Visit Plan Continue   Consulted and Agree with Plan of Care Patient        Problem List Patient Active Problem List   Diagnosis Date Noted  . Neck pain 01/07/2015  . Abdominal pain, epigastric 12/15/2014  . Hyperglycemia 01/23/2014  . Routine general medical examination at a health care facility 01/05/2014  . Personal history of colonic polyps-adenoma 08/29/2012  . Hyperlipidemia 07/31/2012  . Anxiety and depression 07/31/2012  . Insomnia 07/31/2012  . Migraine   . Anisocoria     Barbette Hair, PTA 03/14/2015, 2:36 PM  [facility] [address]  [place], [place], [address] Phone: [phone]   Fax:  [phone]

## 2015-03-17 ENCOUNTER — Ambulatory Visit: Payer: 59 | Admitting: Rehabilitation

## 2015-03-17 DIAGNOSIS — M436 Torticollis: Secondary | ICD-10-CM

## 2015-03-17 DIAGNOSIS — M542 Cervicalgia: Secondary | ICD-10-CM

## 2015-03-17 NOTE — Therapy (Addendum)
[facility] [address]  [place], [place], [address] Phone: [phone]   Fax:  [phone]  Physical Therapy Treatment  Patient Details  Name: Rebecca Bentley MRN: [id number] Date of Birth: 1961-09-08 Referring Provider:  Earnie Larsson, MD  Encounter Date: 03/17/2015      PT End of Session - 03/17/15 1316    Visit Number 7   Number of Visits 12   Date for PT Re-Evaluation 03/31/15   PT Start Time 1315   PT Stop Time 1407   PT Time Calculation (min) 52 min      Past Medical History  Diagnosis Date  . Migraine   . Hyperlipidemia   . Anxiety   . Anisocoria   . RAD (reactive airway disease)   . Allergy   . Clostridium difficile colitis     Past Surgical History  Procedure Laterality Date  . Refractive surgery  2007    Dr. Gershon Crane  . Colonoscopy  08/2012  . Wisdom tooth extraction  07/31/13    There were no vitals filed for this visit.  Visit Diagnosis:  Neck pain  Neck stiffness      Subjective Assessment - 03/17/15 1318    Subjective "No more pain than normal."   Currently in Pain? Yes   Pain Score 3    Pain Location Neck   Pain Orientation Left       TODAY'S TREATMENT: Manual -Supine STM and stretch to bil scalenes (L>R) then STM to bil cervical paraspinals, SOR Then stretching to bil UT and LS. Gentle PROM for cervical rotation  TherEx - UBE LVL 2.5 90"/90" Hooklying on Foam Roll: pec stretch/t-spine extension on Foam Roll, Pullover 5# 10x3", angel arms Blue TB 10x, D2 extension Blue TB (with PTA holding band) 10x, Alternating horizontal abduction with 3# 10x each (difficult with control on Lt), pec stretch with elbows extended and flexed  POE protraction/retraction 10x Low row 25# 15x  Mechanical Traction - c-spine, 20degree pull, 60"/20", 16#/8#, 15'        PT Short Term Goals - 02/23/15 1554    PT SHORT TERM GOAL #1   Title pt independent with initial HEP by 03/03/15   Status  Achieved           PT Long Term Goals - 02/23/15 1554    PT LONG TERM GOAL #1   Title pt independent with advanced HEP as necessary by 03/31/15   Status On-going   PT LONG TERM GOAL #2   Title c-spine AROM painfree and WFL by 03/31/15   Status On-going   PT LONG TERM GOAL #3   Title pt able to drive and perform work duties (computer work and prolonged stand/walking) without restriction by neck pain by 03/31/15   Status On-going   PT LONG TERM GOAL #4   Title pt denies UE N/T by 03/31/15   Status On-going               Plan - 03/17/15 1350    Clinical Impression Statement Good tolerance to new foam roll exercises but pt did note lack of control with Lt horizontal abd.    PT Next Visit Plan Continue        Problem List Patient Active Problem List   Diagnosis Date Noted  . Neck pain 01/07/2015  . Abdominal pain, epigastric 12/15/2014  . Hyperglycemia 01/23/2014  . Routine general medical examination at a health care facility 01/05/2014  . Personal  history of colonic polyps-adenoma 08/29/2012  . Hyperlipidemia 07/31/2012  . Anxiety and depression 07/31/2012  . Insomnia 07/31/2012  . Migraine   . Anisocoria     Barbette Hair, PTA 03/17/2015, 1:51 PM  [facility] [address]  [place], [place], [address] Phone: [phone]   Fax:  [phone]     PHYSICAL THERAPY DISCHARGE SUMMARY  Visits from Start of Care: 7  Current functional level related to goals / functional outcomes: Unknown   Remaining deficits: Neck pain   Education / Equipment: HEP Plan: Patient agrees to discharge.  Patient goals were not met. Patient is being discharged due to not returning since the last visit.  ?????       Mrs. Copenhaver has not returned since her 03/15/15 PT treatment. PTA's note from that day indicate minimal improvement since beginning therapy.  Pt therefore being discharged at this time due to not  returning and lack of progress with PT.  Leonette Most PT, OCS 04/15/2015 7:30 AM

## 2015-03-21 ENCOUNTER — Ambulatory Visit: Payer: 59 | Admitting: Physical Therapy

## 2015-03-22 ENCOUNTER — Encounter: Payer: Self-pay | Admitting: Physician Assistant

## 2015-03-22 ENCOUNTER — Ambulatory Visit (INDEPENDENT_AMBULATORY_CARE_PROVIDER_SITE_OTHER): Payer: 59 | Admitting: Physician Assistant

## 2015-03-22 VITALS — BP 124/86 | HR 86 | Temp 98.5°F | Resp 16 | Ht 64.5 in | Wt 162.0 lb

## 2015-03-22 DIAGNOSIS — M5412 Radiculopathy, cervical region: Secondary | ICD-10-CM | POA: Diagnosis not present

## 2015-03-22 MED ORDER — METHYLPREDNISOLONE 4 MG PO TBPK: ORAL_TABLET | ORAL | Status: DC

## 2015-03-22 MED ORDER — METHYLPREDNISOLONE ACETATE 40 MG/ML IJ SUSP
40.0000 mg | Freq: Once | INTRAMUSCULAR | Status: AC
  Administered 2015-03-22: 40 mg via INTRAMUSCULAR

## 2015-03-22 MED ORDER — HYDROCODONE-ACETAMINOPHEN 10-325 MG PO TABS: 1.0000 | ORAL_TABLET | Freq: Three times a day (TID) | ORAL | Status: DC | PRN

## 2015-03-22 NOTE — Patient Instructions (Signed)
Please take pain medication (Norco) as directed for pain. Please start the Medrol Pack tomorrow as directed. No heavy lifting or overexertion. Schedule a follow-up appointment with your Neurosurgeon.

## 2015-03-22 NOTE — Progress Notes (Signed)
Patient presents to clinic today c/o cervical neck pain radiating down right upper extremity x 3 days.  Symptoms not responding to Mobic and muscle relaxants. Patient with history of cervical stenosis, currently followed by Neurosurgery. Denies weakness of extremities or decreased ROM. Denies fever, chills or malaise.  Past Medical History  Diagnosis Date  . Migraine   . Hyperlipidemia   . Anxiety   . Anisocoria   . RAD (reactive airway disease)   . Allergy   . Clostridium difficile colitis     Current Outpatient Prescriptions on File Prior to Visit  Medication Sig Dispense Refill  . ALPRAZolam (XANAX) 0.5 MG tablet Take 1 tablet (0.5 mg total) by mouth 3 (three) times daily as needed. 90 tablet 0  . atorvastatin (LIPITOR) 20 MG tablet TAKE 1 TABLET BY MOUTH DAILY. 90 tablet 1  . buPROPion (WELLBUTRIN XL) 150 MG 24 hr tablet TAKE 1 TABLET BY MOUTH DAILY. 90 tablet 1  . cyclobenzaprine (FLEXERIL) 5 MG tablet Take 1 tablet (5 mg total) by mouth 3 (three) times daily as needed for muscle spasms. 20 tablet 0  . escitalopram (LEXAPRO) 20 MG tablet TAKE 1 TABLET BY MOUTH DAILY. 90 tablet 1  . meloxicam (MOBIC) 7.5 MG tablet Take 1 tablet (7.5 mg total) by mouth daily. 14 tablet 0  . Multiple Vitamin (MULTIVITAMIN) tablet Take 1 tablet by mouth daily.    Marland Kitchen RA KRILL OIL 500 MG CAPS Take 1 capsule by mouth daily.    Marland Kitchen saccharomyces boulardii (FLORASTOR) 250 MG capsule Take 250 mg by mouth 2 (two) times daily.    . SUMAtriptan (IMITREX) 100 MG tablet Take 1 tablet (100 mg total) by mouth every 2 (two) hours as needed. 10 tablet 6  . traZODone (DESYREL) 100 MG tablet TAKE 1 TABLET (100 MG TOTAL) BY MOUTH AT BEDTIME. 90 tablet 1   No current facility-administered medications on file prior to visit.    Allergies  Allergen Reactions  . Penicillins Anaphylaxis  . Clindamycin/Lincomycin Diarrhea    Family History  Problem Relation Age of Onset  . Hyperlipidemia Father   . Heart disease  Father     stenting- age 11  . Migraines Father   . Hypertension Father   . Cancer Father     pancreas  . Diabetes Father   . Mental illness Brother     anxiety/depression  . Migraines Daughter   . Allergies Daughter   . Mental illness Mother     anxiety  . Hyperlipidemia Mother   . Breast cancer Mother   . Stroke Maternal Grandmother   . Hypertension Maternal Grandmother   . Hyperlipidemia Maternal Grandfather   . Heart disease Maternal Grandfather   . Uterine cancer Paternal Grandmother   . Migraines Paternal Grandmother   . Heart disease Paternal Grandfather   . Cancer Paternal Grandfather     History   Social History  . Marital Status: Married    Spouse Name: Gwyndolyn Saxon  . Number of Children: 1  . Years of Education: N/A   Occupational History  . 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 Smoking status: Never Smoker   . Smokeless tobacco: Never Used  . Alcohol Use: Yes     Comment: occasional  . Drug Use: No  . Sexual Activity:    Partners: Male    Birth Control/ Protection: Pill   Other Topics Concern  . 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  All other ROS are negative.  BP 124/86 mmHg  Pulse 86  Temp(Src) 98.5 F (36.9 C) (Oral)  Resp 16  Ht 5' 4.5" (1.638 m)  Wt 162 lb (73.483 kg)  BMI 27.39 kg/m2  SpO2 97%  Physical Exam  Constitutional: She is oriented to person, place, and time and well-developed, well-nourished, and in no distress.  HENT:  Head: Normocephalic and atraumatic.  Eyes: Conjunctivae are normal.  Neck: Normal range of motion. Neck supple.  Cardiovascular: Normal rate, regular rhythm, normal heart sounds and intact distal pulses.   Pulmonary/Chest: Effort normal and breath sounds normal. No respiratory distress. She has no wheezes. She has no  rales. She exhibits no tenderness.  Musculoskeletal:       Right shoulder: She exhibits pain. She exhibits normal range of motion.       Left shoulder: She exhibits normal range of motion.       Cervical back: She exhibits pain. She exhibits normal range of motion, no tenderness and no spasm.  Neurological: She is alert and oriented to person, place, and time.  Skin: Skin is warm and dry. No rash noted.  Psychiatric: Affect normal.  Vitals reviewed.  Assessment/Plan: Cervical radiculopathy 40 Im Depomedrol given by nursing.  Will begin dose pack tomorrow.  Rx norco for severe breakthrough pain.  Supportive measures discussed.  Patient to schedule follow-up with neurosurgeon this week.

## 2015-03-22 NOTE — Progress Notes (Signed)
Pre visit review using our clinic review tool, if applicable. No additional management support is needed unless otherwise documented below in the visit note/SLS  

## 2015-03-22 NOTE — Assessment & Plan Note (Signed)
40 Im Depomedrol given by nursing.  Will begin dose pack tomorrow.  Rx norco for severe breakthrough pain.  Supportive measures discussed.  Patient to schedule follow-up with neurosurgeon this week.

## 2015-03-24 ENCOUNTER — Ambulatory Visit: Payer: 59 | Admitting: Rehabilitation

## 2015-03-28 ENCOUNTER — Ambulatory Visit: Payer: 59 | Admitting: Rehabilitation

## 2015-03-31 ENCOUNTER — Ambulatory Visit: Payer: 59 | Admitting: Rehabilitation

## 2015-04-14 ENCOUNTER — Encounter: Payer: Self-pay | Admitting: Family Medicine

## 2015-04-14 ENCOUNTER — Ambulatory Visit (INDEPENDENT_AMBULATORY_CARE_PROVIDER_SITE_OTHER): Payer: 59 | Admitting: Family Medicine

## 2015-04-14 VITALS — BP 116/68 | HR 78 | Temp 98.6°F | Wt 161.4 lb

## 2015-04-14 DIAGNOSIS — M5412 Radiculopathy, cervical region: Secondary | ICD-10-CM

## 2015-04-14 DIAGNOSIS — M542 Cervicalgia: Secondary | ICD-10-CM | POA: Diagnosis not present

## 2015-04-14 MED ORDER — METHYLPREDNISOLONE ACETATE 80 MG/ML IJ SUSP
80.0000 mg | Freq: Once | INTRAMUSCULAR | Status: AC
  Administered 2015-04-14: 80 mg via INTRAMUSCULAR

## 2015-04-14 MED ORDER — PREDNISONE 10 MG PO TABS: ORAL_TABLET | ORAL | Status: DC

## 2015-04-14 MED ORDER — CYCLOBENZAPRINE HCL 5 MG PO TABS: 5.0000 mg | ORAL_TABLET | Freq: Three times a day (TID) | ORAL | Status: DC | PRN

## 2015-04-14 NOTE — Progress Notes (Signed)
Pre visit review using our clinic review tool, if applicable. No additional management support is needed unless otherwise documented below in the visit note. 

## 2015-04-14 NOTE — Progress Notes (Signed)
Patient ID: Rebecca Bentley, female    DOB: 02-01-1961  Age: 54 y.o. MRN: [id number]    Subjective:  Subjective HPI CLOVIA REINE presents for neck pain.  Pt having surgery in July.  Pt thinks she just did too much.    Review of Systems  Constitutional: Negative for activity change, appetite change, fatigue and unexpected weight change.  Respiratory: Negative for cough and shortness of breath.   Cardiovascular: Negative for chest pain and palpitations.  Musculoskeletal: Positive for neck pain and neck stiffness.  Neurological: Positive for numbness. Negative for dizziness.  Psychiatric/Behavioral: Negative for behavioral problems and dysphoric mood. The patient is not nervous/anxious.     History Past Medical History  Diagnosis Date  . Migraine   . Hyperlipidemia   . Anxiety   . Anisocoria   . RAD (reactive airway disease)   . Allergy   . Clostridium difficile colitis     She has past surgical history that includes Refractive surgery (2007); Colonoscopy (08/2012); and Wisdom tooth extraction (07/31/13).   Her family history includes Allergies in her daughter; Breast cancer in her mother; Cancer in her father and paternal grandfather; Diabetes in her father; Heart disease in her father, maternal grandfather, and paternal grandfather; Hyperlipidemia in her father, maternal grandfather, and mother; Hypertension in her father and maternal grandmother; Mental illness in her brother and mother; Migraines in her daughter, father, and paternal grandmother; Stroke in her maternal grandmother; Uterine cancer in her paternal grandmother.She reports that she has never smoked. She has never used smokeless tobacco. She reports that she drinks alcohol. She reports that she does not use illicit drugs.  Current Outpatient Prescriptions on File Prior to Visit  Medication Sig Dispense Refill  . ALPRAZolam (XANAX) 0.5 MG tablet Take 1 tablet (0.5 mg total) by mouth 3 (three) times daily as needed. 90  tablet 0  . atorvastatin (LIPITOR) 20 MG tablet TAKE 1 TABLET BY MOUTH DAILY. 90 tablet 1  . buPROPion (WELLBUTRIN XL) 150 MG 24 hr tablet TAKE 1 TABLET BY MOUTH DAILY. 90 tablet 1  . escitalopram (LEXAPRO) 20 MG tablet TAKE 1 TABLET BY MOUTH DAILY. 90 tablet 1  . HYDROcodone-acetaminophen (NORCO) 10-325 MG per tablet Take 1 tablet by mouth every 8 (eight) hours as needed. 30 tablet 0  . Multiple Vitamin (MULTIVITAMIN) tablet Take 1 tablet by mouth daily.    Marland Kitchen RA KRILL OIL 500 MG CAPS Take 1 capsule by mouth daily.    Marland Kitchen saccharomyces boulardii (FLORASTOR) 250 MG capsule Take 250 mg by mouth 2 (two) times daily.    . SUMAtriptan (IMITREX) 100 MG tablet Take 1 tablet (100 mg total) by mouth every 2 (two) hours as needed. 10 tablet 6  . traZODone (DESYREL) 100 MG tablet TAKE 1 TABLET (100 MG TOTAL) BY MOUTH AT BEDTIME. 90 tablet 1  . Turmeric 500 MG CAPS Take by mouth daily. Ginger Combination     No current facility-administered medications on file prior to visit.     Objective:  Objective Physical Exam  Constitutional: She is oriented to person, place, and time. She appears well-developed and well-nourished. No distress.  Eyes: Conjunctivae and EOM are normal.  Neck: Carotid bruit is not present.  Neurological: She is alert and oriented to person, place, and time.  Psychiatric: She has a normal mood and affect. Her behavior is normal.   BP 116/68 mmHg  Pulse 78  Temp(Src) 98.6 F (37 C) (Oral)  Wt 161 lb 6.4 oz (73.211 kg)  SpO2 96% Wt Readings from Last 3 Encounters:  04/14/15 161 lb 6.4 oz (73.211 kg)  03/22/15 162 lb (73.483 kg)  01/22/15 162 lb (73.483 kg)     Lab Results  Component Value Date   WBC 7.6 01/05/2014   HGB 13.7 01/05/2014   HCT 41.6 01/05/2014   PLT 332 01/05/2014   GLUCOSE 87 01/05/2014   CHOL 150 01/05/2014   TRIG 152* 01/05/2014   HDL 49 01/05/2014   LDLCALC 71 01/05/2014   ALT 35 01/05/2014   AST 29 01/05/2014   NA 141 01/05/2014   K 4.9  01/05/2014   CL 103 01/05/2014   CREATININE 0.80 01/05/2014   BUN 14 01/05/2014   CO2 30 01/05/2014   TSH 2.473 01/05/2014   HGBA1C 5.8 10/18/2014    Mr Cervical Spine Wo Contrast  01/22/2015   CLINICAL DATA:  Neck pain.  Intermittent radicular symptoms left arm  EXAM: MRI CERVICAL SPINE WITHOUT CONTRAST  TECHNIQUE: Multiplanar, multisequence MR imaging of the cervical spine was performed. No intravenous contrast was administered.  COMPARISON:  None.  FINDINGS: Normal alignment. Mild cervical kyphosis is present. Negative for fracture or mass.  Negative for cord compression. No cord lesion. Spinal cord signal is normal. Craniocervical junction is normal.  C2-3:  Negative  C3-4: Mild disc degeneration and spurring on the right with mild right foraminal narrowing.  C4-5: Disc degeneration and spondylosis with diffuse uncinate spurring. Mild foraminal narrowing bilaterally.  C5-6: Disc degeneration and spondylosis with diffuse uncinate spurring right greater than left. Foraminal narrowing bilaterally and mild spinal stenosis  C6-7: Mild disc degeneration and spurring with mild foraminal narrowing bilaterally. No cord deformity  C7-T1: Negative  IMPRESSION: Mild right foraminal narrowing C3-4.  Spondylosis and foraminal narrowing bilaterally at C4-5 and C5-6. Mild spinal stenosis at C5-6.  Mild foraminal narrowing bilaterally at C6-7.   Electronically Signed   By: Franchot Gallo M.D.   On: 01/22/2015 13:18     Assessment & Plan:  Plan I have discontinued Ms. Footman's meloxicam and methylPREDNISolone. I am also having her start on predniSONE. Additionally, I am having her maintain her multivitamin, SUMAtriptan, saccharomyces boulardii, RA KRILL OIL, atorvastatin, buPROPion, escitalopram, traZODone, ALPRAZolam, Turmeric, HYDROcodone-acetaminophen, and cyclobenzaprine.  Meds ordered this encounter  Medications  . cyclobenzaprine (FLEXERIL) 5 MG tablet    Sig: Take 1 tablet (5 mg total) by mouth 3  (three) times daily as needed for muscle spasms.    Dispense:  60 tablet    Refill:  1  . predniSONE (DELTASONE) 10 MG tablet    Sig: 3 po qd for 3 days then 2 po qd for 3 days the 1 po qd for 3 days    Dispense:  18 tablet    Refill:  0    Problem List Items Addressed This Visit    Neck pain - Primary   Relevant Medications   cyclobenzaprine (FLEXERIL) 5 MG tablet   predniSONE (DELTASONE) 10 MG tablet   Cervical radiculopathy    Flexeril and depo medrol pred taper rto prn      Relevant Medications   cyclobenzaprine (FLEXERIL) 5 MG tablet      Follow-up: Return if symptoms worsen or fail to improve.  Garnet Koyanagi, DO

## 2015-04-14 NOTE — Patient Instructions (Signed)

## 2015-04-14 NOTE — Assessment & Plan Note (Signed)
Flexeril and depo medrol pred taper rto prn

## 2015-05-20 ENCOUNTER — Other Ambulatory Visit: Payer: Self-pay | Admitting: *Deleted

## 2015-05-20 MED ORDER — ESCITALOPRAM OXALATE 20 MG PO TABS: ORAL_TABLET | ORAL | Status: DC

## 2015-05-20 MED ORDER — ATORVASTATIN CALCIUM 20 MG PO TABS: ORAL_TABLET | ORAL | Status: DC

## 2015-05-20 MED ORDER — BUPROPION HCL ER (XL) 150 MG PO TB24: ORAL_TABLET | ORAL | Status: DC

## 2015-06-02 ENCOUNTER — Telehealth: Payer: Self-pay | Admitting: Family

## 2015-06-02 DIAGNOSIS — Z01818 Encounter for other preprocedural examination: Secondary | ICD-10-CM

## 2015-06-02 NOTE — Telephone Encounter (Signed)
Pt requests lab orders for pre-op labs requested by surgeon.

## 2015-06-07 ENCOUNTER — Other Ambulatory Visit (INDEPENDENT_AMBULATORY_CARE_PROVIDER_SITE_OTHER): Payer: 59

## 2015-06-07 DIAGNOSIS — Z01818 Encounter for other preprocedural examination: Secondary | ICD-10-CM | POA: Diagnosis not present

## 2015-06-07 LAB — CBC WITH DIFFERENTIAL/PLATELET
BASOS ABS: 0.1 10*3/uL (ref 0.0–0.1)
Basophils Relative: 0.9 % (ref 0.0–3.0)
EOS ABS: 0.2 10*3/uL (ref 0.0–0.7)
Eosinophils Relative: 2.6 % (ref 0.0–5.0)
HEMATOCRIT: 38.6 % (ref 36.0–46.0)
Hemoglobin: 12.4 g/dL (ref 12.0–15.0)
Lymphocytes Relative: 34.3 % (ref 12.0–46.0)
Lymphs Abs: 2.3 10*3/uL (ref 0.7–4.0)
MCHC: 32.2 g/dL (ref 30.0–36.0)
MCV: 87.9 fl (ref 78.0–100.0)
MONOS PCT: 6.2 % (ref 3.0–12.0)
Monocytes Absolute: 0.4 10*3/uL (ref 0.1–1.0)
NEUTROS ABS: 3.7 10*3/uL (ref 1.4–7.7)
NEUTROS PCT: 56 % (ref 43.0–77.0)
PLATELETS: 267 10*3/uL (ref 150.0–400.0)
RBC: 4.39 Mil/uL (ref 3.87–5.11)
RDW: 14.7 % (ref 11.5–15.5)
WBC: 6.6 10*3/uL (ref 4.0–10.5)

## 2015-06-07 LAB — BASIC METABOLIC PANEL
BUN: 12 mg/dL (ref 6–23)
CO2: 26 meq/L (ref 19–32)
Calcium: 9.3 mg/dL (ref 8.4–10.5)
Chloride: 109 mEq/L (ref 96–112)
Creatinine, Ser: 0.82 mg/dL (ref 0.40–1.20)
GFR: 77.11 mL/min (ref >60.00)
GLUCOSE: 94 mg/dL (ref 70–99)
POTASSIUM: 4.2 meq/L (ref 3.5–5.1)
Sodium: 145 mEq/L (ref 135–145)

## 2015-06-15 HISTORY — PX: CERVICAL FUSION: SHX112

## 2015-08-22 ENCOUNTER — Ambulatory Visit (INDEPENDENT_AMBULATORY_CARE_PROVIDER_SITE_OTHER): Payer: 59 | Admitting: Family

## 2015-08-22 ENCOUNTER — Encounter: Payer: Self-pay | Admitting: Family

## 2015-08-22 VITALS — BP 136/88 | HR 75 | Temp 97.8°F | Resp 16 | Ht 64.5 in | Wt 168.2 lb

## 2015-08-22 DIAGNOSIS — Z0000 Encounter for general adult medical examination without abnormal findings: Secondary | ICD-10-CM | POA: Diagnosis not present

## 2015-08-22 DIAGNOSIS — Z23 Encounter for immunization: Secondary | ICD-10-CM

## 2015-08-22 NOTE — Progress Notes (Signed)
Subjective:    Patient ID: Rebecca Bentley, female    DOB: 08/26/1961, 54 y.o.   MRN: [id number]  HPI  Rebecca Bentley is a 54 yr old female who presents today for cpx.   Immunizations: tetanus 2011 Diet: trying to eat healthy Exercise:  Colonoscopy: 2014- due for 2019 follow up colo Dexa: 2015 Pap Smear: 2015 Mammogram: up to date Eyes 1/16 Dental: up to date  Review of Systems  Constitutional:       Some weight gain after surgery  HENT: Negative for hearing loss and rhinorrhea.   Eyes: Negative for visual disturbance.  Respiratory: Negative for cough and shortness of breath.   Cardiovascular: Negative for chest pain and leg swelling.  Gastrointestinal: Negative for nausea, vomiting and diarrhea.  Genitourinary: Negative for dysuria and frequency.  Musculoskeletal: Positive for neck pain. Negative for back pain.  Skin: Negative for rash.  Neurological:       Rare headaches,  Mild HA today  Hematological: Negative for adenopathy.  Psychiatric/Behavioral:       Denies depression/anxiety       Past Medical History  Diagnosis Date  . Migraine   . Hyperlipidemia   . Anxiety   . Anisocoria   . RAD (reactive airway disease)   . Allergy   . Clostridium difficile colitis     Social History   Social History  . Marital Status: Married    Spouse Name: Rebecca Bentley  . Number of Children: 1  . Years of Education: N/A   Occupational History  . 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 Smoking status: Never Smoker   . Smokeless tobacco: Never Used  . Alcohol Use: Yes     Comment: occasional  . Drug Use: No  . Sexual Activity:    Partners: Male    Birth Control/ Protection: Pill   Other Topics Concern  . Not on file   Social History Narrative   Married x 25 years, adult daughter lives at home   Has Rebecca Bentley   She is an Therapist, sports at [employer]          Past Surgical History  Procedure Laterality Date  . Refractive  surgery  2007    Dr. Gershon Crane  . Colonoscopy  08/2012  . Wisdom tooth extraction  07/31/13  . Cervical fusion  06/15/15    C4-5 C5-6 with titanium plate    Family History  Problem Relation Age of Onset  . Hyperlipidemia Father   . Heart disease Father     stenting- age 2  . Migraines Father   . Hypertension Father   . Cancer Father     pancreas  . Diabetes Father   . Mental illness Brother     anxiety/depression  . Migraines Daughter   . Allergies Daughter   . Mental illness Mother     anxiety  . Hyperlipidemia Mother   . Breast cancer Mother   . Stroke Maternal Grandmother   . Hypertension Maternal Grandmother   . Hyperlipidemia Maternal Grandfather   . Heart disease Maternal Grandfather   . Uterine cancer Paternal Grandmother   . Migraines Paternal Grandmother   . Heart disease Paternal Grandfather   . Cancer Paternal Grandfather     Allergies  Allergen Reactions  . Penicillins Anaphylaxis  . Clindamycin/Lincomycin Diarrhea    Current Outpatient Prescriptions on File Prior to Visit  Medication Sig Dispense Refill  . ALPRAZolam (  XANAX) 0.5 MG tablet Take 1 tablet (0.5 mg total) by mouth 3 (three) times daily as needed. 90 tablet 0  . atorvastatin (LIPITOR) 20 MG tablet TAKE 1 TABLET BY MOUTH DAILY. 90 tablet 1  . buPROPion (WELLBUTRIN XL) 150 MG 24 hr tablet TAKE 1 TABLET BY MOUTH DAILY. 90 tablet 1  . cyclobenzaprine (FLEXERIL) 5 MG tablet Take 1 tablet (5 mg total) by mouth 3 (three) times daily as needed for muscle spasms. 60 tablet 1  . escitalopram (LEXAPRO) 20 MG tablet TAKE 1 TABLET BY MOUTH DAILY. 90 tablet 1  . HYDROcodone-acetaminophen (NORCO) 10-325 MG per tablet Take 1 tablet by mouth every 8 (eight) hours as needed. 30 tablet 0  . Multiple Vitamin (MULTIVITAMIN) tablet Take 1 tablet by mouth daily.    Marland Kitchen RA KRILL OIL 500 MG CAPS Take 1 capsule by mouth daily.    Marland Kitchen saccharomyces boulardii (FLORASTOR) 250 MG capsule Take 250 mg by mouth 2 (two) times daily.     . SUMAtriptan (IMITREX) 100 MG tablet Take 1 tablet (100 mg total) by mouth every 2 (two) hours as needed. 10 tablet 6  . traZODone (DESYREL) 100 MG tablet TAKE 1 TABLET (100 MG TOTAL) BY MOUTH AT BEDTIME. 90 tablet 1   No current facility-administered medications on file prior to visit.    BP 136/88 mmHg  Pulse 75  Temp(Src) 97.8 F (36.6 C) (Oral)  Resp 16  Ht 5' 4.5" (1.638 m)  Wt 168 lb 3.2 oz (76.295 kg)  BMI 28.44 kg/m2  SpO2 97%    Objective:   Physical Exam Physical Exam  Constitutional: She is oriented to person, place, and time. She appears well-developed and well-nourished. No distress.  HENT:  Head: Normocephalic and atraumatic.  Right Ear: Tympanic membrane and ear canal normal.  Left Ear: Tympanic membrane and ear canal normal.  Mouth/Throat: Oropharynx is clear and moist.  Eyes: Pupils are equal, round, and reactive to light. No scleral icterus.  Neck: Normal range of motion. No thyromegaly present.  Cardiovascular: Normal rate and regular rhythm.   No murmur heard. Pulmonary/Chest: Effort normal and breath sounds normal. No respiratory distress. He has no wheezes. She has no rales. She exhibits no tenderness.  Abdominal: Soft. Bowel sounds are normal. He exhibits no distension and no mass. There is no tenderness. There is no rebound and no guarding.  Musculoskeletal: She exhibits no edema.  Lymphadenopathy:    She has no cervical adenopathy.  Neurological: She is alert and oriented to person, place, and time. She has normal patellar reflexes. She exhibits normal muscle tone. Coordination normal.  Skin: Skin is warm and dry.  Psychiatric: She has a normal mood and affect. Her behavior is normal. Judgment and thought content normal.  Breasts: Examined lying Right: Without masses, retractions, discharge or axillary adenopathy.  Left: Without masses, retractions, discharge or axillary adenopathy.  Pelvic: deferred       Assessment & Plan:            Assessment & Plan:

## 2015-08-22 NOTE — Assessment & Plan Note (Signed)
Immunizations reviewed- flu shot today.   Continue healthy diet, exercise.  Obtain routine labs.

## 2015-08-22 NOTE — Progress Notes (Signed)
Pre visit review using our clinic review tool, if applicable. No additional management support is needed unless otherwise documented below in the visit note. 

## 2015-08-22 NOTE — Patient Instructions (Addendum)
Please complete lab work prior to leaving.  Continue healthy diet and exercise as able such as walking.

## 2015-08-23 ENCOUNTER — Encounter: Payer: Self-pay | Admitting: Family

## 2015-08-23 LAB — HIV ANTIBODY (ROUTINE TESTING W REFLEX): HIV 1&2 Ab, 4th Generation: NONREACTIVE

## 2015-08-23 LAB — URINALYSIS, ROUTINE W REFLEX MICROSCOPIC
Bilirubin Urine: NEGATIVE
Hgb urine dipstick: NEGATIVE
Ketones, ur: NEGATIVE
Leukocytes, UA: NEGATIVE
Nitrite: NEGATIVE
RBC / HPF: NONE SEEN (ref 0–?)
TOTAL PROTEIN, URINE-UPE24: NEGATIVE
URINE GLUCOSE: NEGATIVE
Urobilinogen, UA: 0.2 (ref 0.0–1.0)
pH: 7 (ref 5.0–8.0)

## 2015-08-23 LAB — LIPID PANEL
Cholesterol: 149 mg/dL (ref 0–200)
HDL: 53.9 mg/dL (ref >39.00)
LDL Cholesterol: 68 mg/dL (ref 0–99)
NONHDL: 94.94
Total CHOL/HDL Ratio: 3
Triglycerides: 137 mg/dL (ref 0.0–149.0)
VLDL: 27.4 mg/dL (ref 0.0–40.0)

## 2015-08-23 LAB — HEPATIC FUNCTION PANEL
ALBUMIN: 4.7 g/dL (ref 3.5–5.2)
ALT: 19 U/L (ref 0–35)
AST: 25 U/L (ref 0–37)
Alkaline Phosphatase: 114 U/L (ref 39–117)
Bilirubin, Direct: 0.1 mg/dL (ref 0.0–0.3)
TOTAL PROTEIN: 7.5 g/dL (ref 6.0–8.3)
Total Bilirubin: 0.3 mg/dL (ref 0.2–1.2)

## 2015-08-23 LAB — TSH: TSH: 1.44 u[IU]/mL (ref 0.35–4.50)

## 2015-08-23 LAB — HEPATITIS C ANTIBODY: HCV Ab: NEGATIVE

## 2015-09-07 ENCOUNTER — Other Ambulatory Visit: Payer: Self-pay | Admitting: Family

## 2015-09-07 ENCOUNTER — Other Ambulatory Visit: Payer: Self-pay | Admitting: Family Medicine

## 2015-09-07 NOTE — Telephone Encounter (Signed)
Please advise below request:  Name from pharmacy:  In chart as:  CYCLOBENZAPRINE 5 MG TABLET 5 MG TAB cyclobenzaprine (FLEXERIL) 5 MG tablet     Sig: TAKE 1 TABLET (5 MG) BY MOUTH 3 TIMES A DAY AS NEEDED FOR MUSCLE SPASMS.    Dispense: 60 tablet   Refills: 1   Start: 09/07/2015   Class: Normal    Requested on: 04/14/2015    Originally ordered on: 01/21/2015 05/05/2015

## 2016-01-02 ENCOUNTER — Other Ambulatory Visit: Payer: Self-pay

## 2016-01-02 DIAGNOSIS — Z1231 Encounter for screening mammogram for malignant neoplasm of breast: Secondary | ICD-10-CM

## 2016-01-04 ENCOUNTER — Ambulatory Visit
Admission: RE | Admit: 2016-01-04 | Discharge: 2016-01-04 | Disposition: A | Discharge status: Home / Self Care | Payer: PRIVATE HEALTH INSURANCE | Source: Ambulatory Visit

## 2016-01-04 ENCOUNTER — Other Ambulatory Visit: Payer: Self-pay | Admitting: Family

## 2016-01-04 DIAGNOSIS — Z1231 Encounter for screening mammogram for malignant neoplasm of breast: Secondary | ICD-10-CM

## 2016-01-04 MED FILL — BUPROPION HCL XL 150 MG TAB: 150 | 90 days supply | Qty: 90 | Fill #0

## 2016-01-04 MED FILL — ATORVASTATIN 20 MG TABLET: 20 | 90 days supply | Qty: 90 | Fill #0

## 2016-01-04 MED FILL — traZODone HCL 100 MG TABS: 100 | 90 days supply | Qty: 90 | Fill #1

## 2016-01-04 MED FILL — ESCITALOPRAM 20 MG TABLET: 20 | 90 days supply | Qty: 90 | Fill #0

## 2016-01-04 MED FILL — CYCLOBENZAPRINE 5 MG TABLET: 5 | 20 days supply | Qty: 60 | Fill #1

## 2016-01-20 ENCOUNTER — Ambulatory Visit (INDEPENDENT_AMBULATORY_CARE_PROVIDER_SITE_OTHER): Payer: PRIVATE HEALTH INSURANCE | Admitting: Family

## 2016-01-20 ENCOUNTER — Encounter: Payer: Self-pay | Admitting: Family

## 2016-01-20 VITALS — BP 130/84 | HR 72 | Temp 97.9°F | Resp 16 | Ht 64.5 in | Wt 171.0 lb

## 2016-01-20 DIAGNOSIS — M25579 Pain in unspecified ankle and joints of unspecified foot: Secondary | ICD-10-CM

## 2016-01-20 DIAGNOSIS — M7989 Other specified soft tissue disorders: Secondary | ICD-10-CM

## 2016-01-20 LAB — SEDIMENTATION RATE: Sed Rate: 13 mm/hr (ref 0–30)

## 2016-01-20 LAB — URIC ACID: Uric Acid, Serum: 4.5 mg/dL (ref 2.4–7.0)

## 2016-01-20 LAB — RHEUMATOID FACTOR

## 2016-01-20 MED ORDER — MELOXICAM 7.5 MG PO TABS: 7.5000 mg | ORAL_TABLET | Freq: Every day | ORAL | Status: DC

## 2016-01-20 MED ORDER — FUROSEMIDE 20 MG PO TABS: 20.0000 mg | ORAL_TABLET | Freq: Every day | ORAL | Status: DC | PRN

## 2016-01-20 MED FILL — FUROSEMIDE 20 MG TABLET: 20 | 30 days supply | Qty: 30 | Fill #0

## 2016-01-20 MED FILL — MELOXICAM 7.5 MG TABLET: 7.5 | 14 days supply | Qty: 14 | Fill #0

## 2016-01-20 NOTE — Progress Notes (Signed)
Pre visit review using our clinic review tool, if applicable. No additional management support is needed unless otherwise documented below in the visit note. 

## 2016-01-20 NOTE — Patient Instructions (Addendum)
Complete lab work prior to leaving. Start meloxicam once daily for foot pain. You may use lasix once daily as needed for swelling.

## 2016-01-20 NOTE — Progress Notes (Signed)
Subjective:    Patient ID: Rebecca Bentley, female    DOB: Jul 10, 1961, 55 y.o.   MRN: [id number]  HPI  Rebecca Bentley is a 55 yr old female who presents today with chief complaint of bilateral foot/ankle pain.  Pain has been present x 3 months.  Reports + swelling in both feet. She is using ibuprofen 662m prn.  She is on her feet at work most of the day and wears support hose.    Review of Systems    see HPI  Past Medical History  Diagnosis Date  . Migraine   . Hyperlipidemia   . Anxiety   . Anisocoria   . RAD (reactive airway disease)   . Allergy   . Clostridium difficile colitis     Social History   Social History  . Marital Status: Married    Spouse Name: WGwyndolyn Saxon . Number of Children: 1  . Years of Education: N/A   Occupational History  . 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 Smoking status: Never Smoker   . Smokeless tobacco: Never Used  . Alcohol Use: Yes     Comment: occasional  . Drug Use: No  . Sexual Activity:    Partners: Male    Birth Control/ Protection: Pill   Other Topics Concern  . Not on file   Social History Narrative   Married x 25 years, adult daughter lives at home   Has JJorge Mandril  She is an RTherapist, sportsat [employer]         Past Surgical History  Procedure Laterality Date  . Refractive surgery  2007    Dr. SGershon Crane . Colonoscopy  08/2012  . Wisdom tooth extraction  07/31/13  . Cervical fusion  06/15/15    C4-5 C5-6 with titanium plate    Family History  Problem Relation Age of Onset  . Hyperlipidemia Father   . Heart disease Father     stenting- age 681 . Migraines Father   . Hypertension Father   . Cancer Father     pancreas  . Diabetes Father   . Mental illness Brother     anxiety/depression  . Migraines Daughter   . Allergies Daughter   . Mental illness Mother     anxiety  . Hyperlipidemia Mother   . Breast cancer Mother   . Stroke Maternal Grandmother   .  Hypertension Maternal Grandmother   . Hyperlipidemia Maternal Grandfather   . Heart disease Maternal Grandfather   . Uterine cancer Paternal Grandmother   . Migraines Paternal Grandmother   . Heart disease Paternal Grandfather   . Cancer Paternal Grandfather     Allergies  Allergen Reactions  . Penicillins Anaphylaxis  . Clindamycin/Lincomycin Diarrhea    Current Outpatient Prescriptions on File Prior to Visit  Medication Sig Dispense Refill  . ALPRAZolam (XANAX) 0.5 MG tablet Take 1 tablet (0.5 mg total) by mouth 3 (three) times daily as needed. 90 tablet 0  . atorvastatin (LIPITOR) 20 MG tablet TAKE 1 TABLET BY MOUTH DAILY. 90 tablet 1  . buPROPion (WELLBUTRIN XL) 150 MG 24 hr tablet TAKE 1 TABLET BY MOUTH DAILY. 90 tablet 1  . cyclobenzaprine (FLEXERIL) 5 MG tablet TAKE 1 TABLET (5 MG) BY MOUTH 3 TIMES A DAY AS NEEDED FOR MUSCLE SPASMS. 60 tablet 1  . escitalopram (LEXAPRO) 20 MG tablet TAKE 1 TABLET BY MOUTH DAILY. 90 tablet  1  . Multiple Vitamin (MULTIVITAMIN) tablet Take 1 tablet by mouth daily.    Marland Kitchen RA KRILL OIL 500 MG CAPS Take 1 capsule by mouth daily.    Marland Kitchen saccharomyces boulardii (FLORASTOR) 250 MG capsule Take 250 mg by mouth 2 (two) times daily.    . SUMAtriptan (IMITREX) 100 MG tablet Take 1 tablet (100 mg total) by mouth every 2 (two) hours as needed. 10 tablet 6  . traZODone (DESYREL) 100 MG tablet TAKE 1 TABLET (100 MG TOTAL) BY MOUTH AT BEDTIME. 90 tablet 1   No current facility-administered medications on file prior to visit.    BP 130/84 mmHg  Pulse 72  Temp(Src) 97.9 F (36.6 C) (Oral)  Resp 16  Ht 5' 4.5" (1.638 m)  Wt 171 lb (77.565 kg)  BMI 28.91 kg/m2  SpO2 98%    Objective:   Physical Exam  Constitutional: She is oriented to person, place, and time. She appears well-developed and well-nourished.  Musculoskeletal:  Bilateral foot swelling is noted.  + tenderness to palpation bilateral arches  Neurological: She is alert and oriented to person,  place, and time.  Psychiatric: She has a normal mood and affect. Her behavior is normal. Judgment and thought content normal.          Assessment & Plan:  Foot swelling- could be component of plantar fasciitis.  Advised pt to continue to use her support hose, rx provided for prn lasix.  Advised pt to return for bmet in 2 weeks to assess electrolytes on lasix.  Trial of meloxicam for foot pain, icing.  obtain uric acid, RA, ESR.

## 2016-01-23 ENCOUNTER — Encounter: Payer: Self-pay | Admitting: Family

## 2016-01-23 LAB — ANA: ANA: NEGATIVE

## 2016-02-03 ENCOUNTER — Other Ambulatory Visit: Payer: PRIVATE HEALTH INSURANCE

## 2016-02-13 ENCOUNTER — Ambulatory Visit (INDEPENDENT_AMBULATORY_CARE_PROVIDER_SITE_OTHER): Payer: PRIVATE HEALTH INSURANCE | Admitting: Family

## 2016-02-13 ENCOUNTER — Encounter: Payer: Self-pay | Admitting: Family

## 2016-02-13 VITALS — BP 146/89 | HR 74 | Temp 98.4°F | Resp 16 | Ht 64.5 in | Wt 168.8 lb

## 2016-02-13 DIAGNOSIS — F329 Major depressive disorder, single episode, unspecified: Secondary | ICD-10-CM

## 2016-02-13 DIAGNOSIS — F418 Other specified anxiety disorders: Secondary | ICD-10-CM | POA: Diagnosis not present

## 2016-02-13 DIAGNOSIS — F419 Anxiety disorder, unspecified: Secondary | ICD-10-CM

## 2016-02-13 DIAGNOSIS — M722 Plantar fascial fibromatosis: Secondary | ICD-10-CM

## 2016-02-13 DIAGNOSIS — G43809 Other migraine, not intractable, without status migrainosus: Secondary | ICD-10-CM | POA: Diagnosis not present

## 2016-02-13 DIAGNOSIS — G4700 Insomnia, unspecified: Secondary | ICD-10-CM

## 2016-02-13 MED ORDER — ALPRAZOLAM 0.5 MG PO TABS
0.5000 mg | ORAL_TABLET | Freq: Three times a day (TID) | ORAL | Status: DC | PRN
Start: 2016-02-13 — End: 2022-01-08

## 2016-02-13 NOTE — Progress Notes (Signed)
Pre visit review using our clinic review tool, if applicable. No additional management support is needed unless otherwise documented below in the visit note. 

## 2016-02-13 NOTE — Progress Notes (Signed)
Subjective:    Patient ID: Rebecca Bentley, female    DOB: 02/01/1961, 55 y.o.   MRN: [id number]  HPI  Rebecca Bentley is a 55 yr old female who presents today for follow up.   Anxiety is stable. Mood is good. Report that she uses trazodone PRN.    No recent HA:s  Not using flexeril now that neck is feeling better.   Plantar fasciitis- has been massaging muscles in her leg and this has really helped her pain.  Review of Systems  See HPI  Past Medical History  Diagnosis Date  . Migraine   . Hyperlipidemia   . Anxiety   . Anisocoria   . RAD (reactive airway disease)   . Allergy   . Clostridium difficile colitis     Social History   Social History  . Marital Status: Married    Spouse Name: Gwyndolyn Saxon  . Number of Children: 1  . Years of Education: N/A   Occupational History  . 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 Smoking status: Never Smoker   . Smokeless tobacco: Never Used  . Alcohol Use: Yes     Comment: occasional  . Drug Use: No  . Sexual Activity:    Partners: Male    Birth Control/ Protection: Pill   Other Topics Concern  . Not on file   Social History Narrative   Married x 25 years, adult daughter lives at home   Has Jorge Mandril   She is an Therapist, sports at [employer]          Past Surgical History  Procedure Laterality Date  . Refractive surgery  2007    Dr. Gershon Crane  . Colonoscopy  08/2012  . Wisdom tooth extraction  07/31/13  . Cervical fusion  06/15/15    C4-5 C5-6 with titanium plate    Family History  Problem Relation Age of Onset  . Hyperlipidemia Father   . Heart disease Father     stenting- age 64  . Migraines Father   . Hypertension Father   . Cancer Father     pancreas  . Diabetes Father   . Mental illness Brother     anxiety/depression  . Migraines Daughter   . Allergies Daughter   . Mental illness Mother     anxiety  . Hyperlipidemia Mother   . Breast cancer Mother   . Stroke  Maternal Grandmother   . Hypertension Maternal Grandmother   . Hyperlipidemia Maternal Grandfather   . Heart disease Maternal Grandfather   . Uterine cancer Paternal Grandmother   . Migraines Paternal Grandmother   . Heart disease Paternal Grandfather   . Cancer Paternal Grandfather     Allergies  Allergen Reactions  . Penicillins Anaphylaxis  . Clindamycin/Lincomycin Diarrhea    Current Outpatient Prescriptions on File Prior to Visit  Medication Sig Dispense Refill  . atorvastatin (LIPITOR) 20 MG tablet TAKE 1 TABLET BY MOUTH DAILY. 90 tablet 1  . buPROPion (WELLBUTRIN XL) 150 MG 24 hr tablet TAKE 1 TABLET BY MOUTH DAILY. 90 tablet 1  . escitalopram (LEXAPRO) 20 MG tablet TAKE 1 TABLET BY MOUTH DAILY. 90 tablet 1  . furosemide (LASIX) 20 MG tablet Take 1 tablet (20 mg total) by mouth daily as needed for edema. 30 tablet 3  . Multiple Vitamin (MULTIVITAMIN) tablet Take 1 tablet by mouth daily.    Marland Kitchen RA KRILL OIL 500 MG CAPS Take  1 capsule by mouth daily.    Marland Kitchen saccharomyces boulardii (FLORASTOR) 250 MG capsule Take 250 mg by mouth 2 (two) times daily.    . SUMAtriptan (IMITREX) 100 MG tablet Take 1 tablet (100 mg total) by mouth every 2 (two) hours as needed. 10 tablet 6  . traZODone (DESYREL) 100 MG tablet TAKE 1 TABLET (100 MG TOTAL) BY MOUTH AT BEDTIME. 90 tablet 1   No current facility-administered medications on file prior to visit.    BP 146/89 mmHg  Pulse 74  Temp(Src) 98.4 F (36.9 C) (Oral)  Resp 16  Ht 5' 4.5" (1.638 m)  Wt 168 lb 12.8 oz (76.567 kg)  BMI 28.54 kg/m2  SpO2 97%        Objective:   Physical Exam  Constitutional: She is oriented to person, place, and time. She appears well-developed and well-nourished.  Cardiovascular: Normal rate, regular rhythm and normal heart sounds.   No murmur heard. Pulmonary/Chest: Effort normal and breath sounds normal. No respiratory distress. She has no wheezes.  Neurological: She is alert and oriented to person,  place, and time.  Psychiatric: She has a normal mood and affect. Her behavior is normal. Judgment and thought content normal.          Assessment & Plan:  Plantar fasciitis- improved.

## 2016-02-14 ENCOUNTER — Telehealth: Payer: Self-pay | Admitting: Family

## 2016-02-14 MED FILL — ALPRAZolam 0.5 MG TABS: 0.5 | 30 days supply | Qty: 90 | Fill #0

## 2016-02-14 NOTE — Assessment & Plan Note (Signed)
Stable, monitor.  °

## 2016-02-14 NOTE — Telephone Encounter (Signed)
See mychart.  

## 2016-02-14 NOTE — Assessment & Plan Note (Signed)
Stable on current meds.  Continue same. 

## 2016-02-28 ENCOUNTER — Telehealth: Payer: Self-pay | Admitting: Family

## 2016-02-28 NOTE — Telephone Encounter (Signed)
Could you please contact pt re: unread mychart message?   

## 2016-02-29 NOTE — Telephone Encounter (Signed)
Left detailed message on cell and to call if any questions. 

## 2016-03-01 ENCOUNTER — Encounter: Payer: Self-pay | Admitting: Family

## 2016-03-06 ENCOUNTER — Encounter: Payer: Self-pay | Admitting: Family

## 2016-03-06 DIAGNOSIS — I10 Essential (primary) hypertension: Secondary | ICD-10-CM

## 2016-03-06 MED ORDER — HYDROCHLOROTHIAZIDE 25 MG PO TABS: 25.0000 mg | ORAL_TABLET | Freq: Every day | ORAL | Status: AC

## 2016-03-07 MED FILL — HYDROCHLOROTHIAZIDE 25 MG T: 25 | 30 days supply | Qty: 30 | Fill #0

## 2016-04-11 MED FILL — ATORVASTATIN 20 MG TABLET: 20 | 90 days supply | Qty: 90 | Fill #1

## 2016-04-11 MED FILL — BUPROPION HCL XL 150 MG TAB: 150 | 90 days supply | Qty: 90 | Fill #1

## 2016-04-11 MED FILL — ESCITALOPRAM 20 MG TABLET: 20 | 90 days supply | Qty: 90 | Fill #1

## 2016-06-12 ENCOUNTER — Encounter: Payer: Self-pay | Admitting: Family

## 2016-06-12 DIAGNOSIS — R519 Headache, unspecified: Secondary | ICD-10-CM

## 2016-06-12 DIAGNOSIS — R51 Headache: Principal | ICD-10-CM

## 2016-06-12 MED ORDER — CYCLOBENZAPRINE HCL 5 MG PO TABS: 5.0000 mg | ORAL_TABLET | Freq: Three times a day (TID) | ORAL | Status: DC | PRN

## 2016-06-12 MED ORDER — SUMATRIPTAN SUCCINATE 100 MG PO TABS: 100.0000 mg | ORAL_TABLET | ORAL | Status: DC | PRN

## 2016-06-12 MED FILL — CYCLOBENZAPRINE 5 MG TABLET: 5 | 10 days supply | Qty: 30 | Fill #0

## 2016-06-12 MED FILL — SUMATRIPTAN SUCC 100 MG TAB: 100 | 30 days supply | Qty: 9 | Fill #0

## 2016-06-15 NOTE — Progress Notes (Signed)
This encounter was created in error - please disregard.

## 2016-08-08 MED FILL — HYDROCHLOROTHIAZIDE 25 MG T: 25 | 30 days supply | Qty: 30 | Fill #1

## 2016-09-18 MED FILL — HYDROCHLOROTHIAZIDE 25 MG T: 25 | 30 days supply | Qty: 30 | Fill #2

## 2016-09-18 MED FILL — SUMATRIPTAN SUCC 100 MG TAB: 100 | 30 days supply | Qty: 9 | Fill #1

## 2017-01-31 IMAGING — MR MR CERVICAL SPINE W/O CM
5 series · 34 of 48 positions shown · non-contrast
Comparison: None.

CLINICAL DATA: Neck pain.  Intermittent radicular symptoms left arm

EXAM:
MRI CERVICAL SPINE WITHOUT CONTRAST
TECHNIQUE: Multiplanar, multisequence MR imaging of the cervical spine was
performed. No intravenous contrast was administered.

[Series 2: T2 · sagittal · 3.0mm · 0.56mm/px · 8 of 13 slices shown (1 of 2)]
[im 1/13]
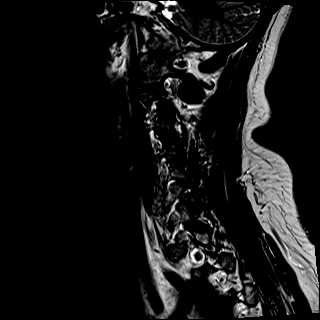
[im 2/13]
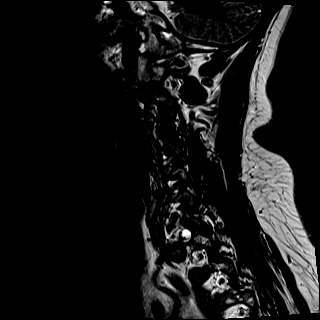
[im 4/13]
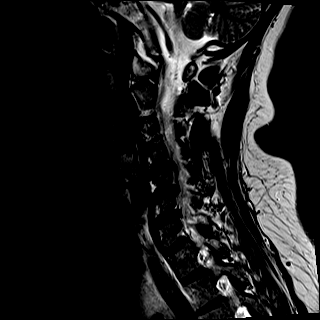
[im 6/13]
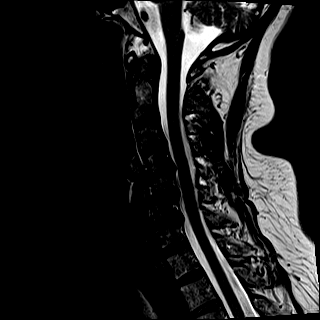
[im 7/13]
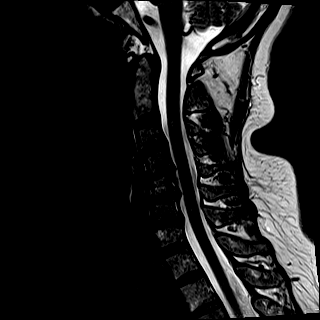
[im 9/13]
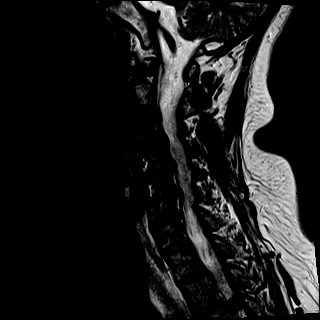
[im 11/13]
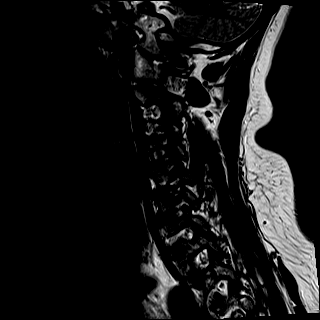
[im 13/13]
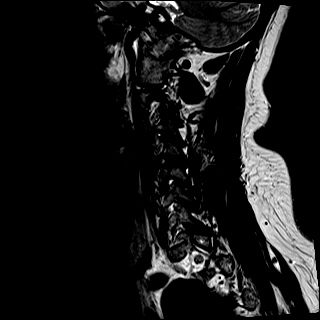

[Series 3: T1 · sagittal · 3.0mm · 0.56mm/px · 7 of 13 slices shown]
[im 1/13]
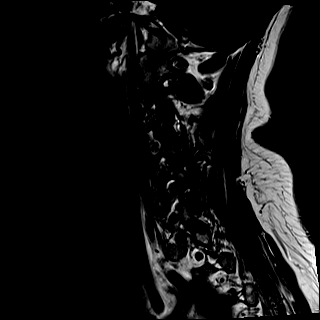
[im 3/13]
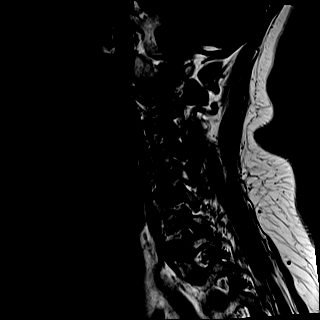
[im 5/13]
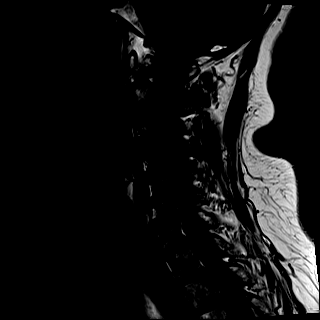
[im 7/13]
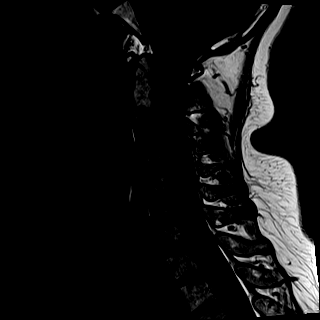
[im 9/13]
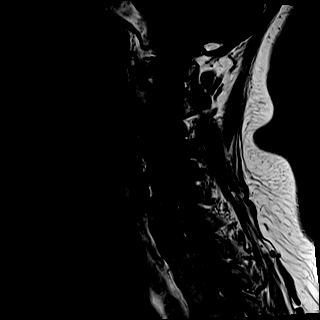
[im 11/13]
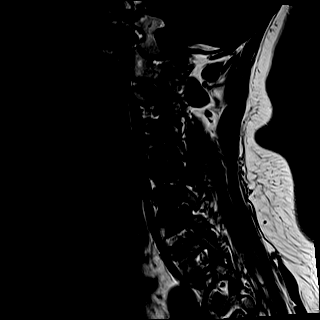
[im 13/13]
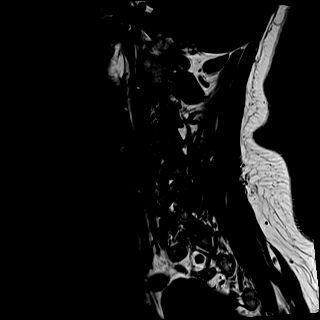

[Series 4: STIR · sagittal · 3.0mm · 0.35mm/px · 7 of 13 slices shown]
[im 1/13]
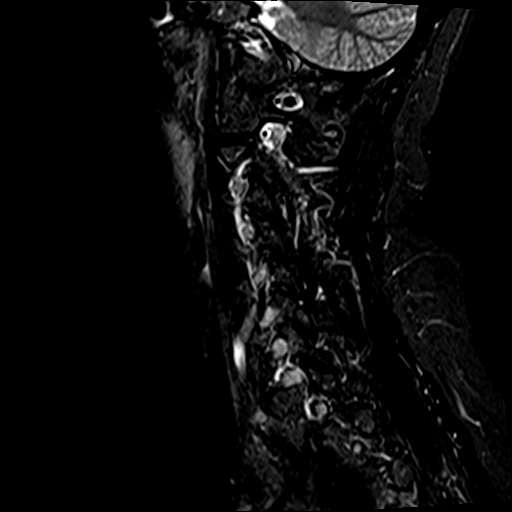
[im 3/13]
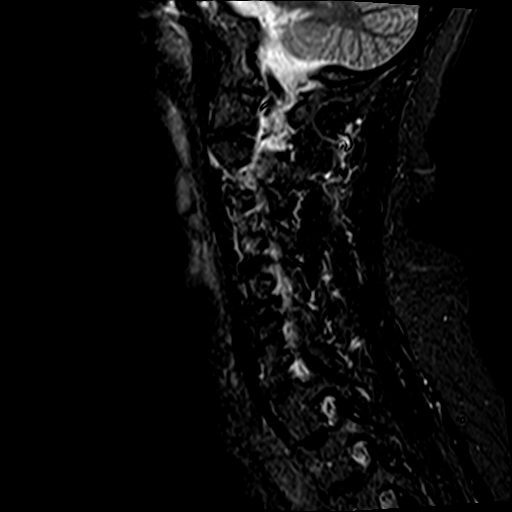
[im 5/13]
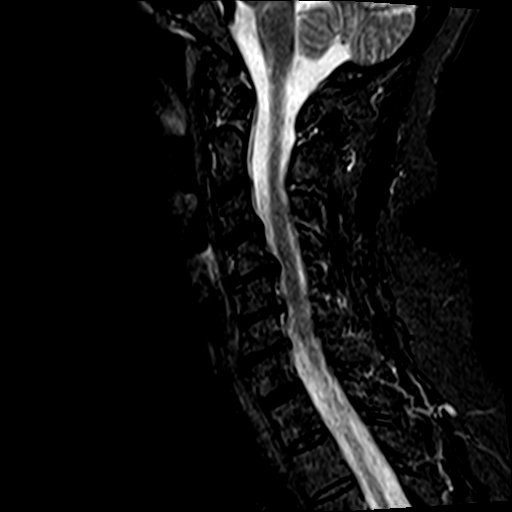
[im 7/13]
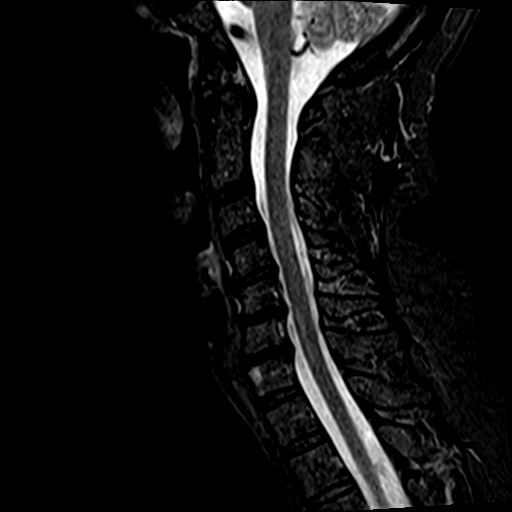
[im 9/13]
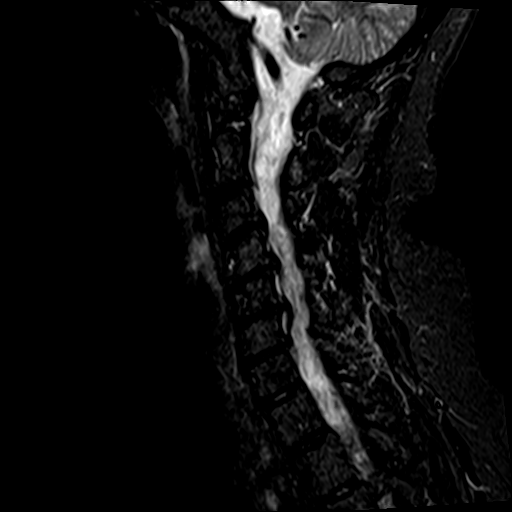
[im 11/13]
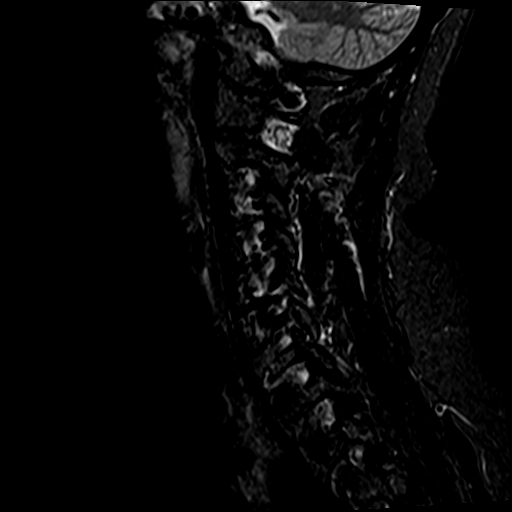
[im 13/13]
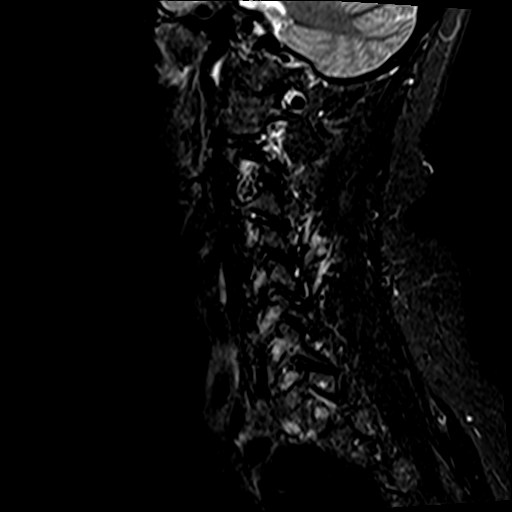

[Series 5: T2 · axial · 3.0mm · 0.62mm/px · z∈[-72,+18]mm · 9 of 25 slices shown (2 of 2)]
[im 1/25]
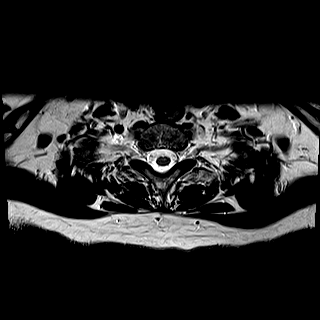
[im 5/25]
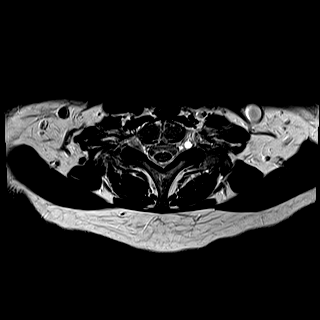
[im 9/25]
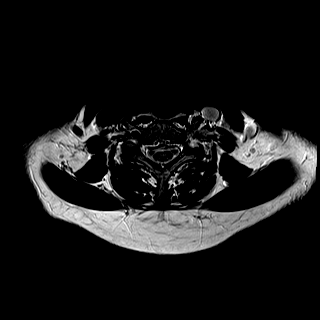
[im 11/25]
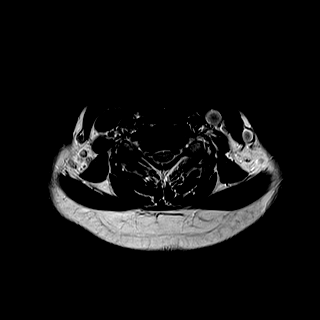
[im 13/25]
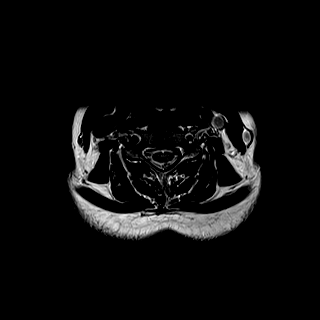
[im 15/25]
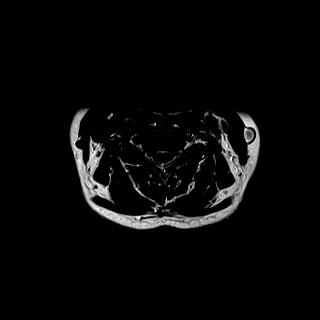
[im 17/25]
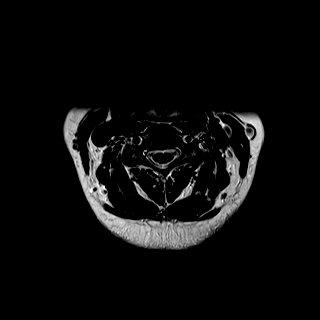
[im 21/25]
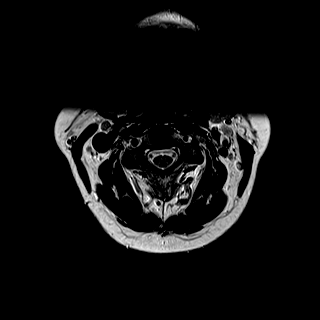
[im 25/25]
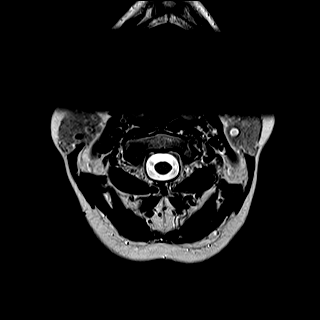

[Series 6: mpgr ax · axial · 3.0mm · 0.35mm/px · z∈[-64,-34]mm · 3 of 25 slices shown]
[im 1/25]
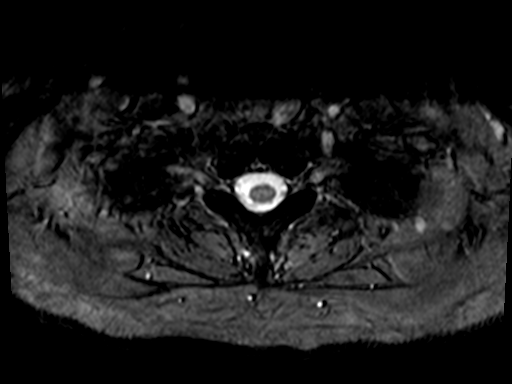
[im 5/25]
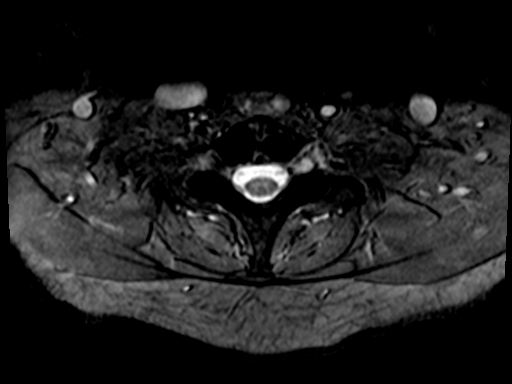
[im 9/25]
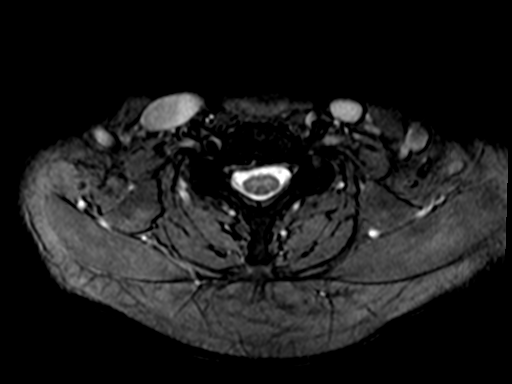

[34 of 48 positions shown; findings below may reference images not displayed]

FINDINGS: Normal alignment. Mild cervical kyphosis is present. Negative for
fracture or mass.

Negative for cord compression. No cord lesion. Spinal cord signal is
normal. Craniocervical junction is normal.

C2-3:  Negative

C3-4: Mild disc degeneration and spurring on the right with mild
right foraminal narrowing.

C4-5: Disc degeneration and spondylosis with diffuse uncinate
spurring. Mild foraminal narrowing bilaterally.

C5-6: Disc degeneration and spondylosis with diffuse uncinate
spurring right greater than left. Foraminal narrowing bilaterally
and mild spinal stenosis

C6-7: Mild disc degeneration and spurring with mild foraminal
narrowing bilaterally. No cord deformity

C7-T1: Negative
IMPRESSION: Mild right foraminal narrowing C3-4.

Spondylosis and foraminal narrowing bilaterally at C4-5 and C5-6.
Mild spinal stenosis at C5-6.

Mild foraminal narrowing bilaterally at C6-7.

## 2017-02-12 MED FILL — SUMATRIPTAN SUCC 100 MG TAB: 100 | 30 days supply | Qty: 9 | Fill #2

## 2017-10-17 ENCOUNTER — Encounter: Payer: Self-pay | Admitting: Internal Medicine

## 2017-12-03 HISTORY — PX: APPENDECTOMY: SHX54

## 2021-12-21 ENCOUNTER — Telehealth: Payer: Self-pay | Admitting: Internal Medicine

## 2021-12-21 NOTE — Telephone Encounter (Signed)
Office visit from 11/22/2021 fax to me via PCP.  Questioning when next colonoscopy is due.  Patient had 8 mm adenoma in 2013 and was recommended to have a 5-year follow-up at that time, she is overdue for surveillance exam.  Please contact the patient and explain that her PCP Harrison Mons asked me to review this and we are contacting the patient to schedule a direct colonoscopy.

## 2021-12-22 NOTE — Telephone Encounter (Signed)
Pt notified that pt PCP Harrison Mons reached out to Dr. Carlean Purl in regard to questioning when is recall recommended for Colonoscopy for pt. Pt was given Dr. Carlean Purl recommendations: Pt scheduled for a Direct Colonoscopy on 02/09/2022 at 11:00 with Dr. Carlean Purl in the [facility]: Pt made aware  Previsit scheduled for 01/08/2022 at 4:30 Pt made aware Office visit fax from PCP provided to Previsit Nurse: Pt verbalized understanding with all questions answered.

## 2022-01-08 ENCOUNTER — Ambulatory Visit (AMBULATORY_SURGERY_CENTER): Payer: PRIVATE HEALTH INSURANCE | Admitting: *Deleted

## 2022-01-08 ENCOUNTER — Other Ambulatory Visit: Payer: Self-pay

## 2022-01-08 VITALS — Ht 64.5 in | Wt 163.0 lb

## 2022-01-08 DIAGNOSIS — Z86010 Personal history of colonic polyps: Secondary | ICD-10-CM

## 2022-01-08 NOTE — Progress Notes (Signed)

## 2022-02-09 ENCOUNTER — Encounter: Payer: PRIVATE HEALTH INSURANCE | Admitting: Internal Medicine

## 2022-02-16 ENCOUNTER — Encounter: Payer: Self-pay | Admitting: Internal Medicine

## 2022-02-20 ENCOUNTER — Encounter: Payer: Self-pay | Admitting: Internal Medicine

## 2022-02-23 ENCOUNTER — Encounter: Payer: Self-pay | Admitting: Internal Medicine

## 2022-02-23 ENCOUNTER — Ambulatory Visit (AMBULATORY_SURGERY_CENTER): Payer: Managed Care, Other (non HMO) | Admitting: Internal Medicine

## 2022-02-23 VITALS — BP 141/89 | HR 78 | Temp 97.7°F | Resp 18 | Ht 64.0 in | Wt 163.0 lb

## 2022-02-23 DIAGNOSIS — Z09 Encounter for follow-up examination after completed treatment for conditions other than malignant neoplasm: Secondary | ICD-10-CM | POA: Diagnosis not present

## 2022-02-23 DIAGNOSIS — Z86010 Personal history of colonic polyps: Secondary | ICD-10-CM

## 2022-02-23 MED ORDER — SODIUM CHLORIDE 0.9 % IV SOLN: 500.0000 mL | INTRAVENOUS | Status: DC

## 2022-02-23 NOTE — Progress Notes (Signed)
Sedate, gd SR, tolerated procedure well, VSS, report to RN 

## 2022-02-23 NOTE — Patient Instructions (Addendum)
No polyps today! ?Next routine colonoscopy or other screening test in 10 years - 2033. ? ?I appreciate the opportunity to care for you. ?Gatha Mayer, MD, [facility] ? ? ?Handout given on diverticulosis.   ?YOU HAD AN ENDOSCOPIC PROCEDURE TODAY AT [facility]:   Refer to the procedure report that was given to you for any specific questions about what was found during the examination.  If the procedure report does not answer your questions, please call your gastroenterologist to clarify.  If you requested that your care partner not be given the details of your procedure findings, then the procedure report has been included in a sealed envelope for you to review at your convenience later. ? ?YOU SHOULD EXPECT: Some feelings of bloating in the abdomen. Passage of more gas than usual.  Walking can help get rid of the air that was put into your GI tract during the procedure and reduce the bloating. If you had a lower endoscopy (such as a colonoscopy or flexible sigmoidoscopy) you may notice spotting of blood in your stool or on the toilet paper. If you underwent a bowel prep for your procedure, you may not have a normal bowel movement for a few days. ? ?Please Note:  You might notice some irritation and congestion in your nose or some drainage.  This is from the oxygen used during your procedure.  There is no need for concern and it should clear up in a day or so. ? ?SYMPTOMS TO REPORT IMMEDIATELY: ? ?Following lower endoscopy (colonoscopy or flexible sigmoidoscopy): ? Excessive amounts of blood in the stool ? Significant tenderness or worsening of abdominal pains ? Swelling of the abdomen that is new, acute ? Fever of 100?F or higher ? ?For urgent or emergent issues, a gastroenterologist can be reached at any hour by calling [phone]. ?Do not use MyChart messaging for urgent concerns.  ? ? ?DIET:  We do recommend a small meal at first, but then you may proceed to your regular diet.  Drink plenty of  fluids but you should avoid alcoholic beverages for 24 hours. ? ?ACTIVITY:  You should plan to take it easy for the rest of today and you should NOT DRIVE or use heavy machinery until tomorrow (because of the sedation medicines used during the test).   ? ?FOLLOW UP: ?Our staff will call the number listed on your records 48-72 hours following your procedure to check on you and address any questions or concerns that you may have regarding the information given to you following your procedure. If we do not reach you, we will leave a message.  We will attempt to reach you two times.  During this call, we will ask if you have developed any symptoms of COVID 19. If you develop any symptoms (ie: fever, flu-like symptoms, shortness of breath, cough etc.) before then, please call [phone].  If you test positive for Covid 19 in the 2 weeks post procedure, please call and report this information to [place].   ? ?If any biopsies were taken you will be contacted by phone or by letter within the next 1-3 weeks.  Please call us at [phone] if you have not heard about the biopsies in 3 weeks.  ? ? ?SIGNATURES/CONFIDENTIALITY: ?You and/or your care partner have signed paperwork which will be entered into your electronic medical record.  These signatures attest to the fact that that the information above on your After Visit Summary has been reviewed and is  understood.  Full responsibility of the confidentiality of this discharge information lies with you and/or your care-partner.  ? ?

## 2022-02-23 NOTE — Op Note (Signed)
[facility] ?Patient Name: Rebecca Bentley ?Procedure Date: 02/23/2022 11:34 AM ?MRN: [id number] ?Endoscopist: Gatha Mayer , MD ?Age: 61 ?Referring MD:  ?Date of Birth: December 17, 1960 ?Gender: Female ?Account #: [id number] ?Procedure:                Colonoscopy ?Indications:              Surveillance: Personal history of adenomatous  ?                          polyps on last colonoscopy > 5 years ago, Last  ?                          colonoscopy: 2013 ?Medicines:                Monitored Anesthesia Care ?Procedure:                Pre-Anesthesia Assessment: ?                          - Prior to the procedure, a History and Physical  ?                          was performed, and patient medications and  ?                          allergies were reviewed. The patient's tolerance of  ?                          previous anesthesia was also reviewed. The risks  ?                          and benefits of the procedure and the sedation  ?                          options and risks were discussed with the patient.  ?                          All questions were answered, and informed consent  ?                          was obtained. Prior Anticoagulants: The patient has  ?                          taken no previous anticoagulant or antiplatelet  ?                          agents. ASA Grade Assessment: II - A patient with  ?                          mild systemic disease. After reviewing the risks  ?                          and benefits, the patient was deemed in  ?  satisfactory condition to undergo the procedure. ?                          After obtaining informed consent, the colonoscope  ?                          was passed under direct vision. Throughout the  ?                          procedure, the patient's blood pressure, pulse, and  ?                          oxygen saturations were monitored continuously. The  ?                          Olympus #[id number] Colonoscope was introduced  through  ?                          the anus and advanced to the the cecum, identified  ?                          by appendiceal orifice and ileocecal valve. The  ?                          colonoscopy was somewhat difficult due to  ?                          significant looping. Successful completion of the  ?                          procedure was aided by applying abdominal pressure.  ?                          The patient tolerated the procedure well. The  ?                          quality of the bowel preparation was good. The  ?                          ileocecal valve, appendiceal orifice, and rectum  ?                          were photographed. The bowel preparation used was  ?                          Miralax via split dose instruction. ?Scope In: 11:45:52 AM ?Scope Out: 12:05:45 PM ?Scope Withdrawal Time: 0 hours 13 minutes 22 seconds  ?Total Procedure Duration: 0 hours 19 minutes 53 seconds  ?Findings:                 The perianal and digital rectal examinations were  ?                          normal. ?  A few diverticula were found in the sigmoid colon. ?                          A diffuse area of melanotic mucosa was found in the  ?                          entire colon. ?                          The exam was otherwise without abnormality on  ?                          direct and retroflexion views. ?Complications:            No immediate complications. ?Estimated Blood Loss:     Estimated blood loss: none. ?Impression:               - Diverticulosis in the sigmoid colon. ?                          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 ?                          - The examination was otherwise normal on direct  ?                          and retroflexion views. ?                          - No specimens collected. ?                          - Personal history of colonic polyp 8 mm rectal  ?                          adenoma 2013. ?Recommendation:           - Patient  has a contact number available for  ?                          emergencies. The signs and symptoms of potential  ?                          delayed complications were discussed with the  ?                          patient. Return to normal activities tomorrow.  ?                          Written discharge instructions were provided to the  ?                          patient. ?                          - Resume previous diet. ?                          -  Continue present medications. ?                          - Repeat colonoscopy in 10 years. ?Gatha Mayer, MD ?02/23/2022 12:11:33 PM ?This report has been signed electronically. ?

## 2022-02-23 NOTE — Progress Notes (Signed)
[facility] History and Physical ? ? ?Primary Care Physician:  Harrison Mons, PA ? ? ?Reason for Procedure:  Polyp surveillance ? ?Plan:    colonoscopy ? ? ? ? ?HPI: Rebecca Bentley is a 61 y.o. female here for surveillance exam hx 8 mm rectal adenoma 2013 ? ? ?Past Medical History:  ?Diagnosis Date  ? Allergy   ? Anisocoria   ? Anxiety   ? Clostridium difficile colitis   ? Hyperlipidemia   ? Hypertension   ? Migraine   ? Personal history of colonic polyps-adenoma 08/29/2012  ? 08/2012 - 8 mm adenoma - anticipate repeat colon about 08/2017  ? RAD (reactive airway disease)   ? ? ?Past Surgical History:  ?Procedure Laterality Date  ? APPENDECTOMY  2019  ? CERVICAL FUSION  06/15/2015  ? C4-5 C5-6 with titanium plate  ? COLONOSCOPY  08/03/2012  ? POLYPECTOMY    ? REFRACTIVE SURGERY  12/03/2005  ? Dr. Gershon Crane  ? WISDOM TOOTH EXTRACTION  07/31/2013  ? ? ?Prior to Admission medications   ?Medication Sig Start Date End Date Taking? Authorizing Provider  ?atorvastatin (LIPITOR) 20 MG tablet TAKE 1 TABLET BY MOUTH DAILY. 01/04/16  Yes Debbrah Alar, NP  ?hydrochlorothiazide (HYDRODIURIL) 25 MG tablet Take 1 tablet (25 mg total) by mouth daily. 03/06/16  Yes Debbrah Alar, NP  ?Rolan Lipa 145 MCG CAPS capsule Take 145 mcg by mouth daily. 11/22/21  Yes [provider]  ?lisinopril (ZESTRIL) 10 MG tablet Take 10 mg by mouth daily. 12/21/21  Yes [provider]  ?loratadine (CLARITIN) 10 MG tablet Take 10 mg by mouth daily.   Yes [provider]  ?omeprazole (PRILOSEC) 20 MG capsule Take 20 mg by mouth daily. 12/20/21  Yes [provider]  ?tiZANidine (ZANAFLEX) 2 MG tablet Take by mouth. 11/01/21  Yes [provider]  ?Multiple Vitamin (MULTIVITAMIN) tablet Take 1 tablet by mouth daily.    [provider]  ?ondansetron (ZOFRAN) 4 MG tablet Take 4 mg by mouth every 8 (eight) hours as needed for nausea or vomiting.    [provider]  ?promethazine  (PHENERGAN) 25 MG tablet Take 25 mg by mouth every 6 (six) hours as needed for nausea or vomiting.    [provider]  ?traMADol (ULTRAM) 50 MG tablet Take by mouth. 03/06/21 03/06/22  [provider]  ? ? ?Current Outpatient Medications  ?Medication Sig Dispense Refill  ? atorvastatin (LIPITOR) 20 MG tablet TAKE 1 TABLET BY MOUTH DAILY. 90 tablet 1  ? hydrochlorothiazide (HYDRODIURIL) 25 MG tablet Take 1 tablet (25 mg total) by mouth daily. 30 tablet 3  ? LINZESS 145 MCG CAPS capsule Take 145 mcg by mouth daily.    ? lisinopril (ZESTRIL) 10 MG tablet Take 10 mg by mouth daily.    ? loratadine (CLARITIN) 10 MG tablet Take 10 mg by mouth daily.    ? omeprazole (PRILOSEC) 20 MG capsule Take 20 mg by mouth daily.    ? tiZANidine (ZANAFLEX) 2 MG tablet Take by mouth.    ? Multiple Vitamin (MULTIVITAMIN) tablet Take 1 tablet by mouth daily.    ? ondansetron (ZOFRAN) 4 MG tablet Take 4 mg by mouth every 8 (eight) hours as needed for nausea or vomiting.    ? promethazine (PHENERGAN) 25 MG tablet Take 25 mg by mouth every 6 (six) hours as needed for nausea or vomiting.    ? traMADol (ULTRAM) 50 MG tablet Take by mouth.    ? ?Current Facility-Administered Medications  ?  Medication Dose Route Frequency Provider Last Rate Last Admin  ? 0.9 %  sodium chloride infusion  500 mL Intravenous Continuous Gatha Mayer, MD      ? ? ?Allergies as of 02/23/2022 - Review Complete 02/23/2022  ?Allergen Reaction Noted  ? Penicillins Anaphylaxis 04/23/2012  ? Clindamycin/lincomycin Diarrhea 12/04/2012  ? ? ?Family History  ?Problem Relation Age of Onset  ? Mental illness Mother   ?     anxiety  ? Hyperlipidemia Mother   ? Breast cancer Mother   ? Hyperlipidemia Father   ? Heart disease Father   ?     stenting- age 36  ? Migraines Father   ? Hypertension Father   ? Cancer Father   ?     pancreas  ? Diabetes Father   ? Mental illness Brother   ?     anxiety/depression  ? Stroke Maternal Grandmother   ? Hypertension Maternal  Grandmother   ? Hyperlipidemia Maternal Grandfather   ? Heart disease Maternal Grandfather   ? Uterine cancer Paternal Grandmother   ? Migraines Paternal Grandmother   ? Heart disease Paternal Grandfather   ? Cancer Paternal Grandfather   ? Migraines Daughter   ? Allergies Daughter   ? Colon cancer Neg Hx   ? Colon polyps Neg Hx   ? ? ?Social History  ? ?Socioeconomic History  ? Marital status: Married  ?  Spouse name: Gwyndolyn Saxon  ? Number of children: 1  ? Years of education: Not on file  ? Highest education level: Not on file  ?Occupational History  ? Occupation: Therapist, sports, Chief Operating Officer  ?  Employer: [employer]  ?  Comment: [employer]  ?Tobacco Use  ? Smoking status: Never  ? Smokeless tobacco: Never  ?Vaping Use  ? Vaping Use: Never used  ?Substance and Sexual Activity  ? Alcohol use: Yes  ?  Comment: occasional  ? Drug use: No  ? Sexual activity: Yes  ?  Partners: Male  ?  Birth control/protection: Pill  ?Other Topics Concern  ? Not on file  ?Social History Narrative  ? Married x 25 years, adult daughter lives at home  ? Has Orpha Bur Terrier  ? She is an Therapist, sports at [employer]  ?   ?   ? ? ?Review of Systems: ? ?All other review of systems negative except as mentioned in the HPI. ? ?Physical Exam: ?Vital signs ?BP (!) 158/96   Pulse 82   Temp 97.7 ?F (36.5 ?C)   Resp 12   Ht '5\' 4"'$  (1.626 m)   Wt 163 lb (73.9 kg)   LMP 07/13/2013   SpO2 99%   BMI 27.98 kg/m?  ? ?General:   Alert,  Well-developed, well-nourished, pleasant and cooperative in NAD ?Lungs:  Clear throughout to auscultation.   ?Heart:  Regular rate and rhythm; no murmurs, clicks, rubs,  or gallops. ?Abdomen:  Soft, nontender and nondistended. Normal bowel sounds.   ?Neuro/Psych:  Alert and cooperative. Normal mood and affect. A and O x 3 ? ? ?'@Sande Pickert'$  Simonne Maffucci, MD, [facility] Regal ?[facility] ?[phone] (pager) ?02/23/2022 11:43 AM@ ? ?

## 2022-02-27 ENCOUNTER — Telehealth: Payer: Self-pay

## 2022-02-27 NOTE — Telephone Encounter (Signed)
?  Follow up Call- ? ? ?  02/23/2022  ? 10:49 AM  ?Call back number  ?Post procedure Call Back phone  # [phone]  ?Permission to leave phone message Yes  ?  ? ?Patient questions: ? ?Do you have a fever, pain , or abdominal swelling? No. ?Pain Score  0 * ? ?Have you tolerated food without any problems? Yes ? ?Have you been able to return to your normal activities? Yes.   ? ?Do you have any questions about your discharge instructions: ?Diet   No. ?Medications  No. ?Follow up visit  No. ? ?Do you have questions or concerns about your Care? No. ? ?Actions: ?* If pain score is 4 or above: ?No action needed, pain <4. ? ? ?
